# Patient Record
Sex: Female | Born: 1940 | Race: White | Hispanic: No | Marital: Married | Smoking: Former smoker
Health system: Southern US, Community
[De-identification: ages and names within clinical notes are randomized; demographics above are authoritative.]

## PROBLEM LIST (undated history)

## (undated) DIAGNOSIS — F329 Major depressive disorder, single episode, unspecified: Secondary | ICD-10-CM

## (undated) DIAGNOSIS — E785 Hyperlipidemia, unspecified: Secondary | ICD-10-CM

## (undated) DIAGNOSIS — D649 Anemia, unspecified: Secondary | ICD-10-CM

## (undated) DIAGNOSIS — K219 Gastro-esophageal reflux disease without esophagitis: Secondary | ICD-10-CM

## (undated) DIAGNOSIS — J45909 Unspecified asthma, uncomplicated: Secondary | ICD-10-CM

## (undated) DIAGNOSIS — Z7901 Long term (current) use of anticoagulants: Secondary | ICD-10-CM

## (undated) DIAGNOSIS — I4821 Permanent atrial fibrillation: Secondary | ICD-10-CM

## (undated) DIAGNOSIS — N6019 Diffuse cystic mastopathy of unspecified breast: Secondary | ICD-10-CM

## (undated) DIAGNOSIS — I10 Essential (primary) hypertension: Secondary | ICD-10-CM

## (undated) DIAGNOSIS — F32A Depression, unspecified: Secondary | ICD-10-CM

## (undated) DIAGNOSIS — J449 Chronic obstructive pulmonary disease, unspecified: Secondary | ICD-10-CM

## (undated) DIAGNOSIS — K589 Irritable bowel syndrome without diarrhea: Secondary | ICD-10-CM

## (undated) HISTORY — DX: Unspecified asthma, uncomplicated: J45.909

## (undated) HISTORY — DX: Essential (primary) hypertension: I10

## (undated) HISTORY — DX: Gastro-esophageal reflux disease without esophagitis: K21.9

## (undated) HISTORY — PX: NECK SURGERY: SHX720

## (undated) HISTORY — DX: Chronic obstructive pulmonary disease, unspecified: J44.9

## (undated) HISTORY — DX: Hyperlipidemia, unspecified: E78.5

## (undated) HISTORY — PX: CHOLECYSTECTOMY: SHX55

## (undated) HISTORY — DX: Diffuse cystic mastopathy of unspecified breast: N60.19

## (undated) HISTORY — DX: Irritable bowel syndrome, unspecified: K58.9

## (undated) HISTORY — PX: APPENDECTOMY: SHX54

## (undated) HISTORY — DX: Depression, unspecified: F32.A

## (undated) HISTORY — DX: Major depressive disorder, single episode, unspecified: F32.9

---

## 2000-01-13 ENCOUNTER — Emergency Department (HOSPITAL_COMMUNITY): Admission: EM | Admit: 2000-01-13 | Discharge: 2000-01-13 | Payer: Self-pay | Admitting: Emergency Medicine

## 2000-01-13 ENCOUNTER — Encounter: Payer: Self-pay | Admitting: Emergency Medicine

## 2000-07-23 ENCOUNTER — Other Ambulatory Visit: Admission: RE | Admit: 2000-07-23 | Discharge: 2000-07-23 | Payer: Self-pay | Admitting: *Deleted

## 2001-04-25 ENCOUNTER — Encounter: Admission: RE | Admit: 2001-04-25 | Discharge: 2001-04-25 | Payer: Self-pay | Admitting: *Deleted

## 2001-04-25 ENCOUNTER — Encounter: Payer: Self-pay | Admitting: *Deleted

## 2003-11-01 ENCOUNTER — Encounter: Admission: RE | Admit: 2003-11-01 | Discharge: 2003-11-01 | Payer: Self-pay | Admitting: Family Medicine

## 2004-08-29 ENCOUNTER — Encounter: Admission: RE | Admit: 2004-08-29 | Discharge: 2004-08-29 | Payer: Self-pay | Admitting: Family Medicine

## 2005-03-05 ENCOUNTER — Encounter: Admission: RE | Admit: 2005-03-05 | Discharge: 2005-03-05 | Payer: Self-pay | Admitting: Family Medicine

## 2005-06-28 ENCOUNTER — Encounter: Admission: RE | Admit: 2005-06-28 | Discharge: 2005-06-28 | Payer: Self-pay | Admitting: Family Medicine

## 2007-06-19 HISTORY — PX: TOTAL KNEE ARTHROPLASTY: SHX125

## 2008-03-02 ENCOUNTER — Inpatient Hospital Stay (HOSPITAL_COMMUNITY): Admission: RE | Admit: 2008-03-02 | Discharge: 2008-03-05 | Payer: Self-pay | Admitting: Orthopedic Surgery

## 2010-10-31 NOTE — Discharge Summary (Signed)
Heather Howard, Heather Howard                ACCOUNT NO.:  [id number]   MEDICAL RECORD NO.:  [id number]          PATIENT TYPE:  INP   LOCATION:  1616                         FACILITY:  [facility]   PHYSICIAN:  Deidre Ala, M.D.    DATE OF BIRTH:  1940-10-14   DATE OF ADMISSION:  03/02/2008  DATE OF DISCHARGE:  03/05/2008                               DISCHARGE SUMMARY   FINAL DIAGNOSES:  1. Degenerative joint disease, left knee.  2. Hypertension.  3. Asthma.   PROCEDURES:  March 02, 2008:  Left total knee arthroplasty.   SURGEON:  1. Charlesetta Shanks, M.D.   HISTORY:  This is a 70 year old Caucasian female followed by Dr. Renae Fickle  for chronic left knee pain.  She also has had pain in the right knee but  it is not as severe.  She has failed medical management for her left  knee pain and subsequently is ready to undergo total knee arthroplasty.  This was scheduled.  The patient was admitted on March 02, 2008.   HOSPITAL COURSE:  After admission the patient underwent total knee  arthroplasty of her left knee on March 02, 2008.  She tolerated the  procedure well with no intraoperative complications.  Postop the patient  did well for the most part.  She was not ready to go home.  During her  stay she was planning on going to a skilled nursing facility for rehab  prior to going home after discharge.  Her course overall she did well.  She did have knee pain initially which did improve with time.  She was  getting out of bed with the help of physical therapy.  She was not quite  getting out by herself prior to discharge.  Lab work was done.  She did  have postop blood loss anemia.  Her hemoglobin on March 05, 2008 was  7.8 with hematocrit 23.9.  This was repeated because this was felt not  to be correct, and we did want to give blood unnecessarily, so a repeat  CBC was done on her and it showed that hemoglobin was 9.1, hematocrit  27.6.  Area nursing homes were contacted for this patient and  prior to  March 05, 2008 two choices were [facility] and [facility], and these  were being checked at the time of this discharge.  She is ready for  discharge to a skilled nursing facility when it becomes available.  It  is anticipated it will be available today and we will discharge her  today.   At the time of discharge her medications were:  1. Lovenox 30 mg subcu q. 12 h.  She will take this for the next 10      days.  2. She is on ferrous sulfate 325 mg t.i.d. p.c.  This can be stopped      at discharge.  3. She will continue on Percocet 5/325 one p.o. q.4-6 p.r.n. pain.  4. Skelaxin 800 mg take 1 p.o. q.i.d. p.r.n.  5. She is on Diovan HCT 320/12.5 mg daily.  6. She is on omeprazole 20 mg  b.i.d.  7. She is on Singulair 10 mg daily.  8. She is on Xyzal 5 mg daily.  9. She was on nabumetone.  She can stop this at this time.  10.She is on Azmacort 240 mg 4 puffs a.m. and p.m.  11.Proventil.  She is on 2 puffs b.i.d.  12.She is on benzonatate 200 mg 3 times a day as needed.  13.She takes __________ 0.125 mg sublingually as needed.  14.She is on desipramine 50 mg as needed.  15.She takes Mucinex as needed.  16.She takes Benadryl 25 mg as needed.   SHE DOES HAVE AN ALLERGY TO CARBOCAINE 3% AND PENICILLIN.   She is ready for discharge at this time and we will discharge her on  acceptance to a nursing facility.  We will see her back in Dr. Ollen Gross  office in 10 days.      Phineas Semen, P.A.    ______________________________  Seth Bake. Charlesetta Shanks, M.D.    CL/MEDQ  D:  03/05/2008  T:  03/05/2008  Job:  [id number]

## 2010-10-31 NOTE — Op Note (Signed)
Heather Howard, GILLENTINE                ACCOUNT NO.:  [id number]   MEDICAL RECORD NO.:  [id number]          PATIENT TYPE:  INP   LOCATION:  0006                         FACILITY:  [facility]   PHYSICIAN:  Deidre Ala, M.D.    DATE OF BIRTH:  [birth year]   DATE OF PROCEDURE:  03/02/2008  DATE OF DISCHARGE:                               OPERATIVE REPORT   PREOPERATIVE DIAGNOSES:  1. End-stage degenerative joint disease left knee.  2. High body mass index.   POSTOPERATIVE DIAGNOSES:  1. End-stage degenerative joint disease left knee.  2. High body mass index.   PROCEDURE:  Left total knee arthroplasty using DePuy cemented  components, LCS type with rotating platform and MVT stem.   SURGEON:  1. Charlesetta Shanks, M.D.   ASSISTANT:  Phineas Semen, P.A.   ANESTHESIA:  General with LMA.   CULTURES:  None.   DRAINS:  Two medium Hemovacs to self suction.   ESTIMATED BLOOD LOSS:  100 mL.   REPLACED:  Without.   TOURNIQUET TIME:  Seventy-six minutes.   PATHOLOGIC FINDINGS AND HISTORY:  Ms. Piltz has end-stage DJD bilateral  knees, left greater than right.  At surgery, she had tricompartment  disease.  She does have some osteoporosis.  We were able to fit her with  a medium left femur, a 12.5 rotating platform, a 3 revision cemented  tray, a size medium rotating platform 10 mm, universal stem fluted at 75  x 14-mm and a 32 mm oval domed three peg patella.  We used two batches  of cement with 1.3 grams of tobramycin per batch.  We had full  extension, flexion to 105 with good stability, varus-valgus stressing.  We did do a lateral release to improve tilt and track of the patella,  but we had overall good ligamentous balance.   DESCRIPTION OF PROCEDURE:  With adequate anesthesia obtained using LMA  technique, 1 gm vancomycin given IV prophylaxis, the patient was placed  in the supine position.  The left lower extremity was prepped from the  toes to the tourniquet in the standard fashion.   After standard prepping  and draping, Esmarch exsanguination was used.  The tourniquet was let up  to 250 mmHg.  A median parapatellar skin incision was followed by a  median parapatellar retinacular incision.  The incision was deepened  sharply with a knife and hemostasis obtained using the Bovie  electrocoagulator.  The patella was then everted.  Fat pad excised, as  well as both cruciates and the menisci.  Retractors were placed.  I then  amputated the tibial spine.  I then drilled down the intermedullary  canal of the tibia and reamed successively to a 14.  I then placed the  tibial cutting jig in place with the intramedullary reamer, set it and  made a very conservative cut.  I then sized to a medium, drilled the  intramedullary anterior-posterior cutting jig guide into the canal and  set it with a 12.5 C clamp, actually reaming it up a total of 5 mm  __________flexion gap.  I set it for the  12.5.  I then made the anterior-  posterior cuts and even with moving it up, we still came just at the  edge of the anterior femur on the anterior cut.  In flexion, we fit at  this time 10, later of course it was 12.5.  We then placed a 4 degree  distal valgus cutting jig in place.  We cut 5 back, made that cut and  then we fit the 10 in flexion and extension.  We then placed the  finishing guide on the distal femur and made those cuts.  We then sized  the femur to a 3, drilled the intramedullary conical reamer, then the  distal reamer for the stem.  We impacted the trial and then impacted it  with the keel.  We then placed on the trial femur and articulated the  knee through a range of motion.  The patella was then callipered to a  23.  We cut it down with the cutting jig to 13, placed the three peg  patella template, made those drill holes and then trialed the patella.  It was tight, so we did a lateral release.  All trial components were  then removed while we checked for sizing as the  components came on the  field.  Cement was mixed with tobramycin in a cement gun.  We assembled  the tibia.  We then cemented on the tibial component, impacted it and  removed excess cement.  We then put in a 10 rotating platform.  We then  cemented on the femoral component, impacted it, removed excess cement  and held until the cement had cured.  We cemented on the patella  component, impacted it and removed the excess cement.  I felt we were a  little hyperextended, so I trialed a 12.5.  I then settled on a 12.5 and  implanted a 12.5 rotating platform with full extension, but no  hyperextension and flexion as above.  Remember that prior to  implantation, a thorough jet lavage was carried out.  We then again jet  lavaged the knee while the cement was curing.  The tourniquet was let  down and bleeding points were cauterized.  Hemovac drains were placed in  the mediolateral gutter and brought out the superolateral portal.  The  wound was then closed in layers with figure-of-eight Vicryl on the  retinaculum #1, running locking oversew of #1 PDS, 0, 2-0 and 3-0 Vicryl  on the subcu and skin staples.  Hemovac to self suction with the  charging of the wound through the Hemovac with 20 mg of gentamicin and  20 mL of saline.  A bulky sterile compressive dressing was applied.  Hemovac was hooked up to self suction.  The patient then having  tolerated the procedure well was awakened and taken to the recovery room  in satisfactory condition for routine postoperative care, CPM and  analgesia.           ______________________________  V. Charlesetta Shanks, M.D.     VEP/MEDQ  D:  03/02/2008  T:  03/04/2008  Job:  [id number]   cc:   Burnell Blanks, MD  Fax: [phone]   Otho Darner, M.D.  Fax: [phone]

## 2011-03-19 LAB — BASIC METABOLIC PANEL
BUN: 6
BUN: 9
CO2: 29
Chloride: 94 — ABNORMAL LOW
Creatinine, Ser: 0.7
GFR calc Af Amer: >60
GFR calc non Af Amer: >60
GFR calc non Af Amer: >60
Glucose, Bld: 121 — ABNORMAL HIGH
Glucose, Bld: 143 — ABNORMAL HIGH
Glucose, Bld: 148 — ABNORMAL HIGH
Potassium: 4.2
Potassium: 4.2
Sodium: 134 — ABNORMAL LOW

## 2011-03-19 LAB — CBC
HCT: 26.7 — ABNORMAL LOW
HCT: 27.6 — ABNORMAL LOW
HCT: 29.9 — ABNORMAL LOW
Hemoglobin: 9 — ABNORMAL LOW
Hemoglobin: 9.1 — ABNORMAL LOW
Hemoglobin: 9.8 — ABNORMAL LOW
MCV: 92.4
MCV: 93.3
Platelets: 295
Platelets: 375
Platelets: 432 — ABNORMAL HIGH
RBC: 3.19 — ABNORMAL LOW
RDW: 14.1
RDW: 14.5
RDW: 14.6
WBC: 11.2 — ABNORMAL HIGH
WBC: 11.8 — ABNORMAL HIGH
WBC: 8.6

## 2011-03-19 LAB — CROSSMATCH
ABO/RH(D): A POS
Antibody Screen: NEGATIVE

## 2011-03-19 LAB — PREPARE RBC (CROSSMATCH)

## 2011-03-19 LAB — DIFFERENTIAL
Basophils Absolute: 0.1
Eosinophils Relative: 2
Lymphocytes Relative: 18
Lymphs Abs: 2
Neutro Abs: 8.1 — ABNORMAL HIGH

## 2011-03-19 LAB — ABO/RH: ABO/RH(D): A POS

## 2011-03-21 LAB — BASIC METABOLIC PANEL
CO2: 29
Calcium: 9.9
Chloride: 103
Creatinine, Ser: 0.85
GFR calc Af Amer: >60
Glucose, Bld: 109 — ABNORMAL HIGH

## 2011-03-21 LAB — DIFFERENTIAL
Basophils Absolute: 0.1
Basophils Relative: 1
Eosinophils Absolute: 0.4
Monocytes Absolute: 0.5
Monocytes Relative: 5
Neutro Abs: 6.6
Neutrophils Relative %: 68

## 2011-03-21 LAB — CBC
MCHC: 33.1
MCV: 92.5
RBC: 4.23
RDW: 14.8

## 2011-03-21 LAB — URINALYSIS, ROUTINE W REFLEX MICROSCOPIC
Bilirubin Urine: NEGATIVE
Ketones, ur: NEGATIVE
Nitrite: NEGATIVE
Protein, ur: NEGATIVE
Urobilinogen, UA: 0.2

## 2011-03-21 LAB — PROTIME-INR: INR: 1.1

## 2011-08-01 ENCOUNTER — Other Ambulatory Visit: Payer: Self-pay | Admitting: Otolaryngology

## 2011-08-01 DIAGNOSIS — R1310 Dysphagia, unspecified: Secondary | ICD-10-CM

## 2011-08-10 ENCOUNTER — Ambulatory Visit
Admission: RE | Admit: 2011-08-10 | Discharge: 2011-08-10 | Disposition: A | Discharge status: Home / Self Care | Payer: Medicare Other | Source: Ambulatory Visit | Attending: Otolaryngology | Admitting: Otolaryngology

## 2011-08-10 DIAGNOSIS — R1310 Dysphagia, unspecified: Secondary | ICD-10-CM

## 2014-09-02 ENCOUNTER — Telehealth: Payer: Self-pay | Admitting: Cardiology

## 2014-09-02 ENCOUNTER — Telehealth (HOSPITAL_COMMUNITY): Payer: Self-pay | Admitting: Cardiology

## 2014-09-02 NOTE — Telephone Encounter (Signed)
Received records from [facility] for appointment with Dr Stanford Breed on 09/21/14.  Records given to [facility] (medical records) for Dr Jacalyn Lefevre schedule on 09/21/14. lp

## 2014-09-20 NOTE — Progress Notes (Signed)
HPI: 74 year old female for evaluation of atrial fibrillation. Patient recently seen by her primary care physician and noted to be in atrial fibrillation. We were asked to evaluate. She occasionally has dyspnea with extreme activities. No orthopnea, PND, pedal edema, palpitations, chest pain or syncope. Occasional days of weakness and dizziness that do not appear to be new.  Current Outpatient Prescriptions  Medication Sig Dispense Refill  . benzonatate (TESSALON) 200 MG capsule Take 200 mg by mouth as needed for cough.    . Esomeprazole Magnesium (NEXIUM PO) Take 1 capsule by mouth as needed.    Marland Kitchen FLOVENT HFA 110 MCG/ACT inhaler Place 2 puffs into the nose 2 (two) times daily.    . hydrOXYzine (ATARAX/VISTARIL) 25 MG tablet Take 25 mg by mouth as needed.    . hyoscyamine (ANASPAZ) 0.125 MG TBDP disintergrating tablet Place 0.125 mg under the tongue as needed.    . methocarbamol (ROBAXIN) 750 MG tablet Take 750 mg by mouth as needed for muscle spasms.    . montelukast (SINGULAIR) 10 MG tablet Take 1 tablet by mouth daily.    . rivaroxaban (XARELTO) 20 MG TABS tablet Take 20 mg by mouth daily with supper.    . valsartan-hydrochlorothiazide (DIOVAN-HCT) 320-25 MG per tablet Take 1 tablet by mouth daily.    . VENTOLIN HFA 108 (90 BASE) MCG/ACT inhaler 2 puffs 4 (four) times daily.     No current facility-administered medications for this visit.    Allergies  Allergen Reactions  . Carbocaine [Mepivacaine Hcl] Swelling  . Penicillins Swelling     Past Medical History  Diagnosis Date  . Hypertension   . Hyperlipidemia   . COPD (chronic obstructive pulmonary disease)   . Depression   . IBS (irritable bowel syndrome)   . GERD (gastroesophageal reflux disease)   . Fibrocystic breast disease   . Asthma     Past Surgical History  Procedure Laterality Date  . Total knee arthroplasty Bilateral 2009  . Cesarean section    . Neck surgery    . Cholecystectomy    . Appendectomy       History   Social History  . Marital Status: Married    Spouse Name: N/A  . Number of Children: 3  . Years of Education: N/A   Occupational History  . Not on file.   Social History Main Topics  . Smoking status: Former Smoker    Quit date: 09/20/1964  . Smokeless tobacco: Not on file  . Alcohol Use: 0.0 oz/week    0 Standard drinks or equivalent per week     Comment: Rare  . Drug Use: No  . Sexual Activity: Not on file   Other Topics Concern  . Not on file   Social History Narrative  . No narrative on file    Family History  Problem Relation Age of Onset  . Diabetes Mellitus I Mother   . Heart disease Mother     ROS: occasional cough and weakness but no fevers or chills, productive cough, hemoptysis, dysphasia, odynophagia, melena, hematochezia, dysuria, hematuria, rash, seizure activity, orthopnea, PND, pedal edema, claudication. Remaining systems are negative.  Physical Exam:   Blood pressure 118/80, pulse 64, height 5\' 3"  (1.6 m), weight 194 lb 8 oz (88.225 kg).  General:  Well developed/well nourished in NAD Skin warm/dry Patient not depressed No peripheral clubbing Back-normal HEENT-normal/normal eyelids Neck supple/normal carotid upstroke bilaterally; no bruits; no JVD; no thyromegaly chest - CTA/ normal expansion CV - irregular/normal  S1 and S2; no murmurs, rubs or gallops;  PMI nondisplaced Abdomen -NT/ND, no HSM, no mass, + bowel sounds, no bruit 2+ femoral pulses, no bruits Ext-no edema, chords, 2+ DP Neuro-grossly nonfocal  ECG 08/31/2014-atrial fibrillation, right bundle branch block, nonspecific ST changes.  Electrocardiogram today shows atrial fibrillation, RV conduction delay and nonspecific ST changes.

## 2014-09-21 ENCOUNTER — Encounter: Payer: Self-pay | Admitting: Cardiology

## 2014-09-21 ENCOUNTER — Encounter: Payer: Self-pay | Admitting: *Deleted

## 2014-09-21 ENCOUNTER — Ambulatory Visit (INDEPENDENT_AMBULATORY_CARE_PROVIDER_SITE_OTHER): Payer: Commercial Managed Care - HMO | Admitting: Cardiology

## 2014-09-21 VITALS — BP 118/80 | HR 64 | Ht 63.0 in | Wt 194.5 lb

## 2014-09-21 DIAGNOSIS — I10 Essential (primary) hypertension: Secondary | ICD-10-CM

## 2014-09-21 DIAGNOSIS — J45909 Unspecified asthma, uncomplicated: Secondary | ICD-10-CM | POA: Insufficient documentation

## 2014-09-21 DIAGNOSIS — J438 Other emphysema: Secondary | ICD-10-CM | POA: Diagnosis not present

## 2014-09-21 DIAGNOSIS — I4891 Unspecified atrial fibrillation: Secondary | ICD-10-CM | POA: Diagnosis not present

## 2014-09-21 LAB — CBC
HCT: 37.5 % (ref 36.0–46.0)
Hemoglobin: 12.3 g/dL (ref 12.0–15.0)
MCH: 30.3 pg (ref 26.0–34.0)
MCHC: 32.8 g/dL (ref 30.0–36.0)
MCV: 92.4 fL (ref 78.0–100.0)
MPV: 8.9 fL (ref 8.6–12.4)
Platelets: 419 10*3/uL — ABNORMAL HIGH (ref 150–400)
RBC: 4.06 MIL/uL (ref 3.87–5.11)
RDW: 14 % (ref 11.5–15.5)
WBC: 7.6 10*3/uL (ref 4.0–10.5)

## 2014-09-21 LAB — BASIC METABOLIC PANEL WITH GFR
BUN: 15 mg/dL (ref 6–23)
CALCIUM: 9.3 mg/dL (ref 8.4–10.5)
CO2: 24 mEq/L (ref 19–32)
Chloride: 99 mEq/L (ref 96–112)
Creat: 0.8 mg/dL (ref 0.50–1.10)
GFR, EST AFRICAN AMERICAN: 83 mL/min
GFR, EST NON AFRICAN AMERICAN: 72 mL/min
GLUCOSE: 99 mg/dL (ref 70–99)
POTASSIUM: 4 meq/L (ref 3.5–5.3)
SODIUM: 134 meq/L — AB (ref 135–145)

## 2014-09-21 LAB — TSH: TSH: 1.674 u[IU]/mL (ref 0.350–4.500)

## 2014-09-21 MED ORDER — RIVAROXABAN 20 MG PO TABS: 20.0000 mg | ORAL_TABLET | Freq: Every day | ORAL | Status: DC

## 2014-09-21 NOTE — Assessment & Plan Note (Signed)
Blood pressure controlled. Continue present medications. 

## 2014-09-21 NOTE — Patient Instructions (Signed)
Your physician wants you to follow-up in: 3 [place] will receive a reminder letter in the mail two months in advance. If you don't receive a letter, please call our office to schedule the follow-up appointment.   Your physician has requested that you have an echocardiogram. Echocardiography is a painless test that uses sound waves to create images of your heart. It provides your doctor with information about the size and shape of your heart and how well your heart's chambers and valves are working. This procedure takes approximately one hour. There are no restrictions for this procedure.SCHEDULE AT [place]  Your physician has recommended that you wear a 24 HOURholter monitor. Holter monitors are medical devices that record the heart's electrical activity. Doctors most often use these monitors to diagnose arrhythmias. Arrhythmias are problems with the speed or rhythm of the heartbeat. The monitor is a small, portable device. You can wear one while you do your normal daily activities. This is usually used to diagnose what is causing palpitations/syncope (passing out).SCHEDULE AT [place]  Your physician recommends that you HAVE LAB WORK TODAY

## 2014-09-21 NOTE — Assessment & Plan Note (Signed)
Patient presents for evaluation of atrial fibrillation. This is a new diagnosis and duration is unknown. She is essentially asymptomatic. We discussed rhythm control versus rate control. I would favor the latter. Embolic risk factors of age 74, hypertension, female sex and borderline diabetes mellitus. CHADS vasc 5; continue xarelto. Check hemoglobin, renal function and TSH. Check echocardiogram. Her rate appears to be controlled on the medications. Check 24-hour Holter monitor.

## 2014-09-21 NOTE — Assessment & Plan Note (Signed)
Management per primary care. 

## 2014-09-23 ENCOUNTER — Encounter (INDEPENDENT_AMBULATORY_CARE_PROVIDER_SITE_OTHER): Payer: Commercial Managed Care - HMO

## 2014-09-23 ENCOUNTER — Ambulatory Visit (HOSPITAL_COMMUNITY): Payer: Commercial Managed Care - HMO | Attending: Cardiology | Admitting: Radiology

## 2014-09-23 ENCOUNTER — Encounter: Payer: Self-pay | Admitting: *Deleted

## 2014-09-23 DIAGNOSIS — I4891 Unspecified atrial fibrillation: Secondary | ICD-10-CM | POA: Insufficient documentation

## 2014-09-23 NOTE — Progress Notes (Signed)
Patient ID: Heather Howard, female   DOB: 06-May-1941, 74 y.o.   MRN: [id number] [employer] 24 hour holter monitor applied to patient by [place].

## 2014-09-23 NOTE — Progress Notes (Signed)
Echocardiogram performed.  

## 2014-10-01 ENCOUNTER — Telehealth: Payer: Self-pay | Admitting: *Deleted

## 2014-10-01 NOTE — Telephone Encounter (Signed)
Monitor reviewed by dr Stanford Breed shows atrial fib, rate controlled.  pt aware of results

## 2014-12-30 NOTE — Progress Notes (Signed)
      HPI: FU atrial fibrillation. Patient recently seen by her primary care physician and noted to be in atrial fibrillation. We were asked to evaluate. Echo 4/16 showed normal LV function, mild AI, biatrial enlargement, trace MR, mild TR. Holter 4/16 showed rate controlled atrial fibrillation. Since last seen, the patient has dyspnea with more extreme activities but not with routine activities. It is relieved with rest. It is not associated with chest pain. There is no orthopnea, PND or pedal edema. There is no syncope or palpitations. There is no exertional chest pain.   Current Outpatient Prescriptions  Medication Sig Dispense Refill  . benzonatate (TESSALON) 200 MG capsule Take 200 mg by mouth as needed for cough.    . Esomeprazole Magnesium (NEXIUM PO) Take 1 capsule by mouth as needed.    Marland Kitchen FLOVENT HFA 110 MCG/ACT inhaler Place 2 puffs into the nose 2 (two) times daily.    . hydrOXYzine (ATARAX/VISTARIL) 25 MG tablet Take 25 mg by mouth as needed.    . hyoscyamine (ANASPAZ) 0.125 MG TBDP disintergrating tablet Place 0.125 mg under the tongue as needed.    . methocarbamol (ROBAXIN) 750 MG tablet Take 750 mg by mouth as needed for muscle spasms.    . montelukast (SINGULAIR) 10 MG tablet Take 1 tablet by mouth daily.    . rivaroxaban (XARELTO) 20 MG TABS tablet Take 1 tablet (20 mg total) by mouth daily with supper. 30 tablet 12  . valsartan-hydrochlorothiazide (DIOVAN-HCT) 320-25 MG per tablet Take 1 tablet by mouth daily.    . VENTOLIN HFA 108 (90 BASE) MCG/ACT inhaler 2 puffs 4 (four) times daily.     No current facility-administered medications for this visit.     Past Medical History  Diagnosis Date  . Hypertension   . Hyperlipidemia   . COPD (chronic obstructive pulmonary disease)   . Depression   . IBS (irritable bowel syndrome)   . GERD (gastroesophageal reflux disease)   . Fibrocystic breast disease   . Asthma     Past Surgical History  Procedure Laterality Date  .  Total knee arthroplasty Bilateral 2009  . Cesarean section    . Neck surgery    . Cholecystectomy    . Appendectomy      History   Social History  . Marital Status: Married    Spouse Name: N/A  . Number of Children: 3  . Years of Education: N/A   Occupational History  . Not on file.   Social History Main Topics  . Smoking status: Former Smoker    Quit date: 09/20/1964  . Smokeless tobacco: Not on file  . Alcohol Use: 0.0 oz/week    0 Standard drinks or equivalent per week     Comment: Rare  . Drug Use: No  . Sexual Activity: Not on file   Other Topics Concern  . Not on file   Social History Narrative    ROS: no fevers or chills, productive cough, hemoptysis, dysphasia, odynophagia, melena, hematochezia, dysuria, hematuria, rash, seizure activity, orthopnea, PND, pedal edema, claudication. Remaining systems are negative.  Physical Exam: Well-developed obese in no acute distress.  Skin is warm and dry.  HEENT is normal.  Neck is supple.  Chest is clear to auscultation with normal expansion.  Cardiovascular exam is irregular Abdominal exam nontender or distended. No masses palpated. Extremities show no edema. neuro grossly intact

## 2015-01-03 ENCOUNTER — Ambulatory Visit (INDEPENDENT_AMBULATORY_CARE_PROVIDER_SITE_OTHER): Payer: Commercial Managed Care - HMO | Admitting: Cardiology

## 2015-01-03 ENCOUNTER — Encounter: Payer: Self-pay | Admitting: Cardiology

## 2015-01-03 VITALS — BP 148/83 | HR 61 | Ht 63.0 in | Wt 195.7 lb

## 2015-01-03 DIAGNOSIS — I482 Chronic atrial fibrillation, unspecified: Secondary | ICD-10-CM

## 2015-01-03 DIAGNOSIS — I10 Essential (primary) hypertension: Secondary | ICD-10-CM | POA: Diagnosis not present

## 2015-01-03 NOTE — Assessment & Plan Note (Signed)
Patient remains in permanent atrial fibrillation.  She is asymptomatic. Plan rate control. Previous monitor showed rate is controlled on no medications. Embolic risk factors of age 74, hypertension, female sex and borderline diabetes mellitus. CHADS vasc 5; continue xarelto. She states her primary care doctor checks her blood work. She is scheduled to see him tomorrow. We would like her hemoglobin and renal function checked every 6 months.

## 2015-01-03 NOTE — Patient Instructions (Signed)
Your physician wants you to follow-up in: [place] will receive a reminder letter in the mail two months in advance. If you don't receive a letter, please call our office to schedule the follow-up appointment.   LAB WORK= CBC AND BMP

## 2015-01-03 NOTE — Assessment & Plan Note (Signed)
Blood pressure controlled. Continue present medications. 

## 2015-03-09 ENCOUNTER — Telehealth: Payer: Self-pay | Admitting: Cardiology

## 2015-03-09 NOTE — Telephone Encounter (Signed)
Can either check on price of other NOACs or refer to coumadin clinic for initiation Heather Howard

## 2015-03-09 NOTE — Telephone Encounter (Signed)
Pt called in stating the her Xarelto is becoming expensive and she would like to find another medication to take that she can afford. Please f/u with her   Thanks

## 2015-03-09 NOTE — Telephone Encounter (Signed)
Spoke with pt, she is aware of the options. Will check for samples for the pt and then she will decide what she wants to do

## 2015-03-09 NOTE — Telephone Encounter (Signed)
Returned call to patient she stated xarelto too expensive, she has about 1 week left and would like a cheaper blood thinner.Message sent to [facility] for advice.

## 2015-03-10 NOTE — Telephone Encounter (Signed)
Spoke with pt, aware no samples available today. 

## 2015-06-21 DIAGNOSIS — M2550 Pain in unspecified joint: Secondary | ICD-10-CM | POA: Diagnosis not present

## 2015-06-21 DIAGNOSIS — M5489 Other dorsalgia: Secondary | ICD-10-CM | POA: Diagnosis not present

## 2015-06-21 DIAGNOSIS — M546 Pain in thoracic spine: Secondary | ICD-10-CM | POA: Diagnosis not present

## 2015-06-22 ENCOUNTER — Telehealth: Payer: Self-pay | Admitting: Cardiology

## 2015-06-22 MED ORDER — APIXABAN 5 MG PO TABS
5.0000 mg | ORAL_TABLET | Freq: Two times a day (BID) | ORAL | Status: DC
Start: 2015-06-22 — End: 2015-08-08

## 2015-06-22 NOTE — Telephone Encounter (Addendum)
**Note De-Identified Heather Howard Obfuscation** Per Warnell Bureau, Pharm D the pt is advised to stop taking Xarelto and to start taking Eliquis 5 mg BID and to not take her first dose of Eliquis until 24 hours after her last dose of Xarelto. She verbalized understanding and thanked me for my assistance.  Eliquis 5 mg BID sent to [facility] in [place] to fill per the pts request.

## 2015-06-22 NOTE — Telephone Encounter (Signed)
The pt states that she started inching on her arms and the top of her ears in the Fall. She has been using anti itch ointments/creams and taking Benadryl to ease her itching. She states that the itching has been getting worse and she now c/o itching on her buttocks, stomach, around an old incision and arms  She wants to know what to do or if there is another medication she can take in place of Xarelto. Please advise.

## 2015-06-22 NOTE — Telephone Encounter (Signed)
Pt c/o medication issue:  1. Name of Medication: Xarelto   2. How are you currently taking this medication (dosage and times per day)? 1 tab po daily   3. Are you having a reaction (difficulty breathing--STAT)? SOB.Heather Kitchenshe just states that she is having severe itching.   4. What is your medication issue? Having severe itching.Heather Kitchen

## 2015-06-23 DIAGNOSIS — M546 Pain in thoracic spine: Secondary | ICD-10-CM | POA: Diagnosis not present

## 2015-06-23 DIAGNOSIS — M2550 Pain in unspecified joint: Secondary | ICD-10-CM | POA: Diagnosis not present

## 2015-06-23 DIAGNOSIS — M5489 Other dorsalgia: Secondary | ICD-10-CM | POA: Diagnosis not present

## 2015-06-29 DIAGNOSIS — L299 Pruritus, unspecified: Secondary | ICD-10-CM | POA: Diagnosis not present

## 2015-06-29 DIAGNOSIS — L300 Nummular dermatitis: Secondary | ICD-10-CM | POA: Diagnosis not present

## 2015-07-05 DIAGNOSIS — M546 Pain in thoracic spine: Secondary | ICD-10-CM | POA: Diagnosis not present

## 2015-07-05 DIAGNOSIS — M2550 Pain in unspecified joint: Secondary | ICD-10-CM | POA: Diagnosis not present

## 2015-07-05 DIAGNOSIS — M5489 Other dorsalgia: Secondary | ICD-10-CM | POA: Diagnosis not present

## 2015-07-12 DIAGNOSIS — M546 Pain in thoracic spine: Secondary | ICD-10-CM | POA: Diagnosis not present

## 2015-07-12 DIAGNOSIS — M2550 Pain in unspecified joint: Secondary | ICD-10-CM | POA: Diagnosis not present

## 2015-07-12 DIAGNOSIS — M5489 Other dorsalgia: Secondary | ICD-10-CM | POA: Diagnosis not present

## 2015-07-14 DIAGNOSIS — M5489 Other dorsalgia: Secondary | ICD-10-CM | POA: Diagnosis not present

## 2015-07-14 DIAGNOSIS — M546 Pain in thoracic spine: Secondary | ICD-10-CM | POA: Diagnosis not present

## 2015-07-14 DIAGNOSIS — M2550 Pain in unspecified joint: Secondary | ICD-10-CM | POA: Diagnosis not present

## 2015-07-21 DIAGNOSIS — M2550 Pain in unspecified joint: Secondary | ICD-10-CM | POA: Diagnosis not present

## 2015-07-21 DIAGNOSIS — M5489 Other dorsalgia: Secondary | ICD-10-CM | POA: Diagnosis not present

## 2015-07-21 DIAGNOSIS — M546 Pain in thoracic spine: Secondary | ICD-10-CM | POA: Diagnosis not present

## 2015-08-03 ENCOUNTER — Inpatient Hospital Stay (HOSPITAL_COMMUNITY)
Admission: AD | Admit: 2015-08-03 | Discharge: 2015-08-08 | DRG: 249 | Disposition: A | Discharge status: Home / Self Care | Payer: Medicare HMO | Source: Other Acute Inpatient Hospital | Attending: Cardiology | Admitting: Cardiology

## 2015-08-03 DIAGNOSIS — D649 Anemia, unspecified: Secondary | ICD-10-CM | POA: Diagnosis not present

## 2015-08-03 DIAGNOSIS — J449 Chronic obstructive pulmonary disease, unspecified: Secondary | ICD-10-CM | POA: Diagnosis present

## 2015-08-03 DIAGNOSIS — E871 Hypo-osmolality and hyponatremia: Secondary | ICD-10-CM | POA: Diagnosis not present

## 2015-08-03 DIAGNOSIS — I480 Paroxysmal atrial fibrillation: Secondary | ICD-10-CM | POA: Diagnosis not present

## 2015-08-03 DIAGNOSIS — R911 Solitary pulmonary nodule: Secondary | ICD-10-CM | POA: Diagnosis present

## 2015-08-03 DIAGNOSIS — Z7901 Long term (current) use of anticoagulants: Secondary | ICD-10-CM

## 2015-08-03 DIAGNOSIS — I482 Chronic atrial fibrillation: Secondary | ICD-10-CM | POA: Diagnosis present

## 2015-08-03 DIAGNOSIS — I255 Ischemic cardiomyopathy: Secondary | ICD-10-CM | POA: Diagnosis present

## 2015-08-03 DIAGNOSIS — D6489 Other specified anemias: Secondary | ICD-10-CM | POA: Diagnosis not present

## 2015-08-03 DIAGNOSIS — Z79899 Other long term (current) drug therapy: Secondary | ICD-10-CM | POA: Diagnosis not present

## 2015-08-03 DIAGNOSIS — K449 Diaphragmatic hernia without obstruction or gangrene: Secondary | ICD-10-CM | POA: Diagnosis not present

## 2015-08-03 DIAGNOSIS — Z955 Presence of coronary angioplasty implant and graft: Secondary | ICD-10-CM

## 2015-08-03 DIAGNOSIS — I4510 Unspecified right bundle-branch block: Secondary | ICD-10-CM | POA: Diagnosis present

## 2015-08-03 DIAGNOSIS — I214 Non-ST elevation (NSTEMI) myocardial infarction: Principal | ICD-10-CM | POA: Diagnosis present

## 2015-08-03 DIAGNOSIS — R7303 Prediabetes: Secondary | ICD-10-CM | POA: Diagnosis present

## 2015-08-03 DIAGNOSIS — Z96653 Presence of artificial knee joint, bilateral: Secondary | ICD-10-CM | POA: Diagnosis present

## 2015-08-03 DIAGNOSIS — E785 Hyperlipidemia, unspecified: Secondary | ICD-10-CM | POA: Diagnosis present

## 2015-08-03 DIAGNOSIS — I959 Hypotension, unspecified: Secondary | ICD-10-CM | POA: Diagnosis not present

## 2015-08-03 DIAGNOSIS — I4891 Unspecified atrial fibrillation: Secondary | ICD-10-CM | POA: Diagnosis not present

## 2015-08-03 DIAGNOSIS — Z87891 Personal history of nicotine dependence: Secondary | ICD-10-CM | POA: Diagnosis not present

## 2015-08-03 DIAGNOSIS — E876 Hypokalemia: Secondary | ICD-10-CM | POA: Diagnosis not present

## 2015-08-03 DIAGNOSIS — I10 Essential (primary) hypertension: Secondary | ICD-10-CM | POA: Diagnosis not present

## 2015-08-03 DIAGNOSIS — J984 Other disorders of lung: Secondary | ICD-10-CM | POA: Diagnosis not present

## 2015-08-03 DIAGNOSIS — I481 Persistent atrial fibrillation: Secondary | ICD-10-CM | POA: Diagnosis not present

## 2015-08-03 DIAGNOSIS — Z7951 Long term (current) use of inhaled steroids: Secondary | ICD-10-CM | POA: Diagnosis not present

## 2015-08-03 DIAGNOSIS — J45909 Unspecified asthma, uncomplicated: Secondary | ICD-10-CM | POA: Diagnosis present

## 2015-08-03 DIAGNOSIS — R591 Generalized enlarged lymph nodes: Secondary | ICD-10-CM | POA: Diagnosis present

## 2015-08-03 DIAGNOSIS — I2510 Atherosclerotic heart disease of native coronary artery without angina pectoris: Secondary | ICD-10-CM | POA: Diagnosis not present

## 2015-08-03 DIAGNOSIS — J438 Other emphysema: Secondary | ICD-10-CM | POA: Diagnosis not present

## 2015-08-03 DIAGNOSIS — R079 Chest pain, unspecified: Secondary | ICD-10-CM | POA: Diagnosis not present

## 2015-08-03 HISTORY — DX: Anemia, unspecified: D64.9

## 2015-08-03 HISTORY — DX: Permanent atrial fibrillation: I48.21

## 2015-08-03 HISTORY — DX: Long term (current) use of anticoagulants: Z79.01

## 2015-08-03 LAB — CBC WITH DIFFERENTIAL/PLATELET
BASOS ABS: 0 10*3/uL (ref 0.0–0.1)
Basophils Relative: 0 %
EOS PCT: 1 %
Eosinophils Absolute: 0.1 10*3/uL (ref 0.0–0.7)
HCT: 31.5 % — ABNORMAL LOW (ref 36.0–46.0)
Hemoglobin: 10.1 g/dL — ABNORMAL LOW (ref 12.0–15.0)
LYMPHS ABS: 1.2 10*3/uL (ref 0.7–4.0)
LYMPHS PCT: 13 %
MCH: 29.4 pg (ref 26.0–34.0)
MCHC: 32.1 g/dL (ref 30.0–36.0)
MCV: 91.6 fL (ref 78.0–100.0)
Monocytes Absolute: 0.8 10*3/uL (ref 0.1–1.0)
Monocytes Relative: 8 %
NEUTROS ABS: 7.6 10*3/uL (ref 1.7–7.7)
Neutrophils Relative %: 78 %
PLATELETS: 357 10*3/uL (ref 150–400)
RBC: 3.44 MIL/uL — AB (ref 3.87–5.11)
RDW: 14.6 % (ref 11.5–15.5)
WBC: 9.7 10*3/uL (ref 4.0–10.5)

## 2015-08-03 LAB — COMPREHENSIVE METABOLIC PANEL
ALBUMIN: 2.9 g/dL — AB (ref 3.5–5.0)
ALT: 18 U/L (ref 14–54)
ANION GAP: 10 (ref 5–15)
AST: 32 U/L (ref 15–41)
Alkaline Phosphatase: 103 U/L (ref 38–126)
BUN: 11 mg/dL (ref 6–20)
CHLORIDE: 96 mmol/L — AB (ref 101–111)
CO2: 23 mmol/L (ref 22–32)
Calcium: 8.9 mg/dL (ref 8.9–10.3)
Creatinine, Ser: 0.81 mg/dL (ref 0.44–1.00)
GFR calc Af Amer: >60 mL/min (ref >60)
Glucose, Bld: 122 mg/dL — ABNORMAL HIGH (ref 65–99)
POTASSIUM: 3.5 mmol/L (ref 3.5–5.1)
Sodium: 129 mmol/L — ABNORMAL LOW (ref 135–145)
Total Bilirubin: 0.5 mg/dL (ref 0.3–1.2)
Total Protein: 6.1 g/dL — ABNORMAL LOW (ref 6.5–8.1)

## 2015-08-03 LAB — TROPONIN I: TROPONIN I: 1.41 ng/mL — AB (ref <0.031)

## 2015-08-03 LAB — MRSA PCR SCREENING: MRSA BY PCR: NEGATIVE

## 2015-08-03 MED ORDER — ATORVASTATIN CALCIUM 80 MG PO TABS
80.0000 mg | ORAL_TABLET | Freq: Every day | ORAL | Status: DC
  Administered 2015-08-03 – 2015-08-07 (×5): 80 mg via ORAL
  Filled 2015-08-03 (×5): qty 1

## 2015-08-03 MED ORDER — PANTOPRAZOLE SODIUM 40 MG PO TBEC
40.0000 mg | DELAYED_RELEASE_TABLET | Freq: Every day | ORAL | Status: DC
  Administered 2015-08-04 – 2015-08-08 (×5): 40 mg via ORAL
  Filled 2015-08-03 (×5): qty 1

## 2015-08-03 MED ORDER — ZINC OXIDE 11.3 % EX CREA
TOPICAL_CREAM | Freq: Two times a day (BID) | CUTANEOUS | Status: DC
  Administered 2015-08-03 – 2015-08-04 (×2): via TOPICAL
  Administered 2015-08-05 – 2015-08-06 (×4): 1 via TOPICAL
  Administered 2015-08-07: 12:00:00 via TOPICAL
  Administered 2015-08-07: 1 via TOPICAL
  Administered 2015-08-08: 13:00:00 via TOPICAL
  Filled 2015-08-03 (×2): qty 56

## 2015-08-03 MED ORDER — MONTELUKAST SODIUM 10 MG PO TABS
10.0000 mg | ORAL_TABLET | Freq: Every day | ORAL | Status: DC
  Administered 2015-08-03 – 2015-08-08 (×6): 10 mg via ORAL
  Filled 2015-08-03 (×6): qty 1

## 2015-08-03 MED ORDER — GUAIFENESIN-CODEINE 100-10 MG/5ML PO SOLN: 10.0000 mL | Freq: Four times a day (QID) | ORAL | Status: DC | PRN

## 2015-08-03 MED ORDER — BENZONATATE 100 MG PO CAPS
200.0000 mg | ORAL_CAPSULE | Freq: Three times a day (TID) | ORAL | Status: DC | PRN
Start: 2015-08-03 — End: 2015-08-08
  Administered 2015-08-03 – 2015-08-07 (×8): 200 mg via ORAL
  Filled 2015-08-03 (×8): qty 2

## 2015-08-03 MED ORDER — ALBUTEROL SULFATE (2.5 MG/3ML) 0.083% IN NEBU
2.5000 mg | INHALATION_SOLUTION | Freq: Four times a day (QID) | RESPIRATORY_TRACT | Status: DC
  Administered 2015-08-04 – 2015-08-07 (×14): 2.5 mg via RESPIRATORY_TRACT
  Filled 2015-08-03 (×15): qty 3

## 2015-08-03 MED ORDER — ACETAMINOPHEN 325 MG PO TABS: 650.0000 mg | ORAL_TABLET | ORAL | Status: DC | PRN

## 2015-08-03 MED ORDER — NITROGLYCERIN IN D5W 200-5 MCG/ML-% IV SOLN
3.0000 ug/min | INTRAVENOUS | Status: DC
  Administered 2015-08-03: 10 ug/min via INTRAVENOUS

## 2015-08-03 MED ORDER — BUDESONIDE 0.25 MG/2ML IN SUSP
0.2500 mg | Freq: Two times a day (BID) | RESPIRATORY_TRACT | Status: DC
  Administered 2015-08-04 – 2015-08-08 (×9): 0.25 mg via RESPIRATORY_TRACT
  Filled 2015-08-03 (×9): qty 2

## 2015-08-03 MED ORDER — ONDANSETRON HCL 4 MG/2ML IJ SOLN: 4.0000 mg | Freq: Four times a day (QID) | INTRAMUSCULAR | Status: DC | PRN

## 2015-08-03 MED ORDER — ASPIRIN EC 81 MG PO TBEC
81.0000 mg | DELAYED_RELEASE_TABLET | Freq: Every day | ORAL | Status: DC
  Administered 2015-08-03 – 2015-08-05 (×3): 81 mg via ORAL
  Filled 2015-08-03 (×3): qty 1

## 2015-08-03 MED ORDER — HEPARIN BOLUS VIA INFUSION
4000.0000 [IU] | Freq: Once | INTRAVENOUS | Status: AC
  Administered 2015-08-03: 4000 [IU] via INTRAVENOUS
  Filled 2015-08-03: qty 4000

## 2015-08-03 MED ORDER — HEPARIN (PORCINE) IN NACL 100-0.45 UNIT/ML-% IJ SOLN
1000.0000 [IU]/h | INTRAMUSCULAR | Status: DC
  Administered 2015-08-03: 1000 [IU]/h via INTRAVENOUS
  Filled 2015-08-03 (×2): qty 250

## 2015-08-03 NOTE — Progress Notes (Signed)
ANTICOAGULATION CONSULT NOTE - Initial Consult  Pharmacy Consult for heparin  Indication: chest pain/ACS  Allergies  Allergen Reactions  . Carbocaine [Mepivacaine Hcl] Swelling  . Penicillins Swelling    Patient Measurements:   Heparin Dosing Weight: 75 kg  Vital Signs:    Labs: No results for input(s): HGB, HCT, PLT, APTT, LABPROT, INR, HEPARINUNFRC, HEPRLOWMOCWT, CREATININE, CKTOTAL, CKMB, TROPONINI in the last 72 hours.  CrCl cannot be calculated (Unknown ideal weight.).   Medical History: Past Medical History  Diagnosis Date  . Hypertension   . Hyperlipidemia   . COPD (chronic obstructive pulmonary disease)   . Depression   . IBS (irritable bowel syndrome)   . GERD (gastroesophageal reflux disease)   . Fibrocystic breast disease   . Asthma     Medications:  See EMR  Assessment: 75 yo female on Eliquis PTA for persistent AFib. Pt is now admitted with substernal chest pressure. Her last dose of Eliquis was yesterday evening. Will start pt on heparin gtt. CBC stable.  Goal of Therapy:  Heparin level 0.3-0.7 units/ml Monitor platelets by anticoagulation protocol: Yes   Plan:  -Heparin at 4000 units x1 then 1000 units/hr -Daily HL, CBC -First level in the AM -F/u cardiology plans for cath   Harvel Quale 08/03/2015,8:12 PM

## 2015-08-03 NOTE — H&P (Signed)
Patient ID: Heather Howard MRN: [id number], DOB/AGE: 12/30/1940   Admit date: 08/03/2015   Primary Physician: Lillard Anes, MD Primary Cardiologist: Dr. Stanford Breed  Pt. Profile:  75 year-old female with history of permanent atrial fibrillation on Eliquis, hypertension, hyperlipidemia, COPD and GERD presenting as a transfer from [facility] for non-ST elevation myocardial infarction.  Problem List  Past Medical History  Diagnosis Date  . Hypertension   . Hyperlipidemia   . COPD (chronic obstructive pulmonary disease)   . Depression   . IBS (irritable bowel syndrome)   . GERD (gastroesophageal reflux disease)   . Fibrocystic breast disease   . Asthma     Past Surgical History  Procedure Laterality Date  . Total knee arthroplasty Bilateral 2009  . Cesarean section    . Neck surgery    . Cholecystectomy    . Appendectomy       Allergies  Allergies  Allergen Reactions  . Carbocaine [Mepivacaine Hcl] Swelling  . Penicillins Swelling    HPI  Patient is a 75 year old female, followed by Dr. Stanford Breed, with a history of permanent atrial fibrillation, hypertension, hyperlipidemia, COPD and GERD who presents to [facility] as a transfer from [facility] for further workup in the setting of chest pain consistent with angina, elevated troponin and new right bundle branch block on EKG. There is no prior history of CAD.  She reports that she was in her usual state of health until last PM. She developed what she thought were URI symptoms. Sore throat/ burning sensation. Also with cough and body aches. No relief with OTC meds. Later today she developed substernal chest pressure radiating to her neck, both shoulder blades and bilateral jaws. Also with mild dyspnea. No syncope/ near syncope. Her symptoms concerned her, prompting her to seek emergency care.   Initial troponin at [facility] was abnormal at 0.57. Second troponin rose to 1.2. D-dimer was  elevated. Subsequent Chest CT was negative for PE but showed a 1 cm speculated mass in the right middle lobe and enlarged right para tracheal lymph nodes suspicious for malignancy. WBC elevated at 11K.  Other than a new right bundle branch block, there are no ST changes. Her atrial fibrillation is rate controlled. She has been on Eliquis for anticoagulation. Her last dose was last PM. She has been started on IV nitro. She is currently CP free.      Home Medications  Prior to Admission medications   Medication Sig Start Date End Date Taking? Authorizing Provider  apixaban (ELIQUIS) 5 MG TABS tablet Take 1 tablet (5 mg total) by mouth 2 (two) times daily. 06/22/15   Lelon Perla, MD  benzonatate (TESSALON) 200 MG capsule Take 200 mg by mouth as needed for cough.    Historical Provider, MD  Esomeprazole Magnesium (NEXIUM PO) Take 1 capsule by mouth as needed.    Historical Provider, MD  FLOVENT HFA 110 MCG/ACT inhaler Place 2 puffs into the nose 2 (two) times daily. 07/21/14   Historical Provider, MD  hydrOXYzine (ATARAX/VISTARIL) 25 MG tablet Take 25 mg by mouth as needed.    Historical Provider, MD  hyoscyamine (ANASPAZ) 0.125 MG TBDP disintergrating tablet Place 0.125 mg under the tongue as needed.    Historical Provider, MD  methocarbamol (ROBAXIN) 750 MG tablet Take 750 mg by mouth as needed for muscle spasms.    Historical Provider, MD  montelukast (SINGULAIR) 10 MG tablet Take 1 tablet by mouth daily. 08/31/14   Historical Provider,  MD  valsartan-hydrochlorothiazide (DIOVAN-HCT) 320-25 MG per tablet Take 1 tablet by mouth daily. 08/28/14   Historical Provider, MD  VENTOLIN HFA 108 (90 BASE) MCG/ACT inhaler 2 puffs 4 (four) times daily. 09/03/14   Historical Provider, MD    Family History  Family History  Problem Relation Age of Onset  . Diabetes Mellitus I Mother   . Heart disease Mother   . Thrombosis Father   . Diabetes Mother     Social History  Social History   Social History    . Marital Status: Married    Spouse Name: N/A  . Number of Children: 3  . Years of Education: N/A   Occupational History  . Not on file.   Social History Main Topics  . Smoking status: Former Smoker    Quit date: 09/20/1964  . Smokeless tobacco: Not on file  . Alcohol Use: 0.0 oz/week    0 Standard drinks or equivalent per week     Comment: Rare  . Drug Use: No  . Sexual Activity: Not on file   Other Topics Concern  . Not on file   Social History Narrative     Review of Systems General:  No chills, fever, night sweats or weight changes.  Cardiovascular:  No chest pain, dyspnea on exertion, edema, orthopnea, palpitations, paroxysmal nocturnal dyspnea. Dermatological: No rash, lesions/masses Respiratory: No cough, dyspnea Urologic: No hematuria, dysuria Abdominal:   No nausea, vomiting, diarrhea, bright red blood per rectum, melena, or hematemesis Neurologic:  No visual changes, wkns, changes in mental status. All other systems reviewed and are otherwise negative except as noted above.  Physical Exam  There were no vitals taken for this visit.  General: Pleasant, NAD Psych: Normal affect. Neuro: Alert and oriented X 3. Moves all extremities spontaneously. HEENT: Normal  Neck: Supple without bruits or JVD. Lungs:  Resp regular and unlabored, CTA. Heart: irregularly irregular no s3, s4, or murmurs. Abdomen: Soft, non-tender, non-distended, BS + x 4.  Extremities: No clubbing, cyanosis or edema. DP/PT/Radials 2+ and equal bilaterally.  Labs  Troponin (Point of Care Test) No results for input(s): TROPIPOC in the last 72 hours. No results for input(s): CKTOTAL, CKMB, TROPONINI in the last 72 hours. Lab Results  Component Value Date   WBC 7.6 09/21/2014   HGB 12.3 09/21/2014   HCT 37.5 09/21/2014   MCV 92.4 09/21/2014   PLT 419* 09/21/2014   No results for input(s): NA, K, CL, CO2, BUN, CREATININE, CALCIUM, PROT, BILITOT, ALKPHOS, ALT, AST, GLUCOSE in the last 168  hours.  Invalid input(s): LABALBU No results found for: CHOL, HDL, LDLCALC, TRIG No results found for: DDIMER   Radiology/Studies  No results found.  ECG  Atrial fibrillation with a CVR. New RBBB. No ST changes    ASSESSMENT AND PLAN  Principal Problem:   NSTEMI (non-ST elevated myocardial infarction) [facility]) Active Problems:   Atrial fibrillation (HCC)   Essential hypertension   COPD (chronic obstructive pulmonary disease) ([place])   1. NSTEMI: symptoms and enzyme trend is consistent with non-STEMI. She is currently CP free on IV nitro. She will need a LHC +/- PCI. She has been on Eliquis for a/c given permanent atrial fibrillation. Will need to hold for at least 3 doses. Her last dose was night of 08/02/15. Will plan for cath on Friday. Treat with IV heparin per pharmacy until cath. Will cycle cardiac enzymes x 3. 2D echo to assess LVF. Add high intensity statin.   2. Permanent Atrial fibrillation: ventricular  rate is well controlled. Embolic risk factors of age 85, hypertension, female sex and borderline diabetes mellitus = CHADS vasc score of 5.  Hold Eliquis for cath.   3. HTN: hold home valsartan/HCTZ for cath.   4. HLD: mentioned in [place] but not on a statin. No lipid panel on file. Given likelihood for CAD, will check a FLP in the am. Plan to treat with high intensity statin for now.   5. GERD: on Nexium at home. Will treat with Protonix while inpatient.   6. COPD: stable. Continue home inhalers.   7. Cough: patient also notes recent body aches. WBC also elevated. Will screen for flu.   8. Abnormal Chest CT: 1 cm speculated mass in the right middle lobe and enlarged right para tracheal lymph nodes suspicious for malignancy. Will need further w/u.    Signed, Lyda Jester, PA-C 08/03/2015, 7:03 PM

## 2015-08-04 ENCOUNTER — Inpatient Hospital Stay (HOSPITAL_COMMUNITY): Payer: Medicare HMO

## 2015-08-04 ENCOUNTER — Other Ambulatory Visit: Payer: Self-pay | Admitting: Acute Care

## 2015-08-04 ENCOUNTER — Encounter (HOSPITAL_COMMUNITY): Payer: Self-pay

## 2015-08-04 DIAGNOSIS — I214 Non-ST elevation (NSTEMI) myocardial infarction: Principal | ICD-10-CM

## 2015-08-04 DIAGNOSIS — R918 Other nonspecific abnormal finding of lung field: Secondary | ICD-10-CM

## 2015-08-04 DIAGNOSIS — R079 Chest pain, unspecified: Secondary | ICD-10-CM

## 2015-08-04 LAB — BASIC METABOLIC PANEL
ANION GAP: 10 (ref 5–15)
BUN: 9 mg/dL (ref 6–20)
CHLORIDE: 95 mmol/L — AB (ref 101–111)
CO2: 24 mmol/L (ref 22–32)
Calcium: 8.7 mg/dL — ABNORMAL LOW (ref 8.9–10.3)
Creatinine, Ser: 0.73 mg/dL (ref 0.44–1.00)
Glucose, Bld: 113 mg/dL — ABNORMAL HIGH (ref 65–99)
POTASSIUM: 3.4 mmol/L — AB (ref 3.5–5.1)
SODIUM: 129 mmol/L — AB (ref 135–145)

## 2015-08-04 LAB — CBC
HEMATOCRIT: 31.4 % — AB (ref 36.0–46.0)
Hemoglobin: 10 g/dL — ABNORMAL LOW (ref 12.0–15.0)
MCH: 29.2 pg (ref 26.0–34.0)
MCHC: 31.8 g/dL (ref 30.0–36.0)
MCV: 91.8 fL (ref 78.0–100.0)
PLATELETS: 352 10*3/uL (ref 150–400)
RBC: 3.42 MIL/uL — ABNORMAL LOW (ref 3.87–5.11)
RDW: 14.6 % (ref 11.5–15.5)
WBC: 8.2 10*3/uL (ref 4.0–10.5)

## 2015-08-04 LAB — LIPID PANEL
CHOL/HDL RATIO: 2.7 ratio
CHOLESTEROL: 136 mg/dL (ref 0–200)
HDL: 51 mg/dL (ref >40)
LDL Cholesterol: 78 mg/dL (ref 0–99)
TRIGLYCERIDES: 35 mg/dL (ref <150)
VLDL: 7 mg/dL (ref 0–40)

## 2015-08-04 LAB — PROTIME-INR
INR: 1.42 (ref 0.00–1.49)
Prothrombin Time: 17.4 seconds — ABNORMAL HIGH (ref 11.6–15.2)

## 2015-08-04 LAB — TROPONIN I
TROPONIN I: 0.94 ng/mL — AB (ref <0.031)
Troponin I: 1.05 ng/mL (ref <0.031)

## 2015-08-04 LAB — APTT
APTT: 73 s — AB (ref 24–37)
aPTT: 79 seconds — ABNORMAL HIGH (ref 24–37)

## 2015-08-04 LAB — HEPARIN LEVEL (UNFRACTIONATED): Heparin Unfractionated: 1.08 IU/mL — ABNORMAL HIGH (ref 0.30–0.70)

## 2015-08-04 MED ORDER — SODIUM CHLORIDE 0.9 % WEIGHT BASED INFUSION: 1.0000 mL/kg/h | INTRAVENOUS | Status: DC

## 2015-08-04 MED ORDER — SODIUM CHLORIDE 0.9 % WEIGHT BASED INFUSION
3.0000 mL/kg/h | INTRAVENOUS | Status: DC
  Administered 2015-08-05: 3 mL/kg/h via INTRAVENOUS

## 2015-08-04 MED ORDER — SODIUM CHLORIDE 0.9 % IV SOLN: 250.0000 mL | INTRAVENOUS | Status: DC | PRN

## 2015-08-04 MED ORDER — GUAIFENESIN-CODEINE 100-10 MG/5ML PO SOLN
10.0000 mL | Freq: Four times a day (QID) | ORAL | Status: DC | PRN
  Administered 2015-08-04 – 2015-08-06 (×3): 10 mL via ORAL
  Filled 2015-08-04 (×3): qty 10

## 2015-08-04 NOTE — Care Management Note (Signed)
Case Management Note  Patient Details  Name: Heather Howard MRN: [id number] Date of Birth: Nov 05, 1940  Subjective/Objective:       Adm w nstemi             Action/Plan: lives w  Husband, pcp dr Henrene Pastor   Expected Discharge Date:                  Expected Discharge Plan:     In-House Referral:     Discharge planning Services     Post Acute Care Choice:    Choice offered to:     DME Arranged:    DME Agency:     HH Arranged:    [place] Agency:     Status of Service:     Medicare Important Message Given:    Date Medicare IM Given:    Medicare IM give by:    Date Additional Medicare IM Given:    Additional Medicare Important Message give by:     If discussed at [place] of Stay Meetings, dates discussed:    Additional Comments: ur review done  Lacretia Leigh, RN 08/04/2015, 7:36 AM

## 2015-08-04 NOTE — Progress Notes (Signed)
  Echocardiogram 2D Echocardiogram has been performed.  Heather Howard 08/04/2015, 3:30 PM

## 2015-08-04 NOTE — Progress Notes (Signed)
CRITICAL VALUE ALERT  Critical value received:  Troponin 1.41  Date of notification:  08/03/15  Time of notification:  2130  Critical value read back: yes  Nurse who received alert:  Idelia Salm RN   MD notified: Cardiology Fellow aware, Pt is a NSTEMI scheduled to have a cath on Friday 2/17. Pt is on nitro and not having any cheat pain.

## 2015-08-04 NOTE — Consult Note (Signed)
Name: Heather Howard MRN: [id number] DOB: Mar 05, 1941    ADMISSION DATE:  08/03/2015 CONSULTATION DATE:  2/16  REFERRING MD :  Johnsie Cancel   CHIEF COMPLAINT:  Lung mass   BRIEF PATIENT DESCRIPTION:  This is a 50yof who was admitted as a transfer from [facility] for NSTEMI. During initial evaluation had CT chest to r/o PE. This was negative for PE but did identify new RML lung mass w/ Right paratracheal Lymphadenopathy. PCCM was asked to see in consult re: these findings.   SIGNIFICANT EVENTS   STUDIES:  Ct chest 2/15: negative for PE; RML spiculated lung mass ~ 1cm size; enlarged paratracheal LN on right    HISTORY OF PRESENT ILLNESS:   75 year old female who was in her usual state of health until the pm of 2/14. She developed what she thought were URI symptoms. Sore throat/ burning sensation. Also with cough and body aches. No relief with OTC meds. Later on 2/15 she developed substernal chest pressure radiating to her neck, both shoulder blades and bilateral jaws. Also with mild dyspnea. No syncope/ near syncope. Her symptoms concerned her, prompting her to seek emergency care.  Initial troponin at [facility] was abnormal at 0.57. Second troponin rose to 1.2. D-dimer was elevated. Subsequent Chest CT was negative for PE but showed a 1 cm speculated mass in the right middle lobe and enlarged right para tracheal lymph nodes suspicious for malignancy. WBC elevated at 11K. Other than a new right bundle branch block, there are no ST changes. Her atrial fibrillation is rate controlled. She has been on Eliquis for anticoagulation. She has been started on IV nitro. She is currently CP free. [facility] work-up. PCCM was asked to see Re: the CT chest findings.    PAST MEDICAL HISTORY :   has a past medical history of Hypertension; Hyperlipidemia; COPD (chronic obstructive pulmonary disease) ([place]); Depression; IBS (irritable bowel syndrome); GERD  (gastroesophageal reflux disease); Fibrocystic breast disease; and Asthma.  has past surgical history that includes Total knee arthroplasty (Bilateral, 2009); Cesarean section; Neck surgery; Cholecystectomy; and Appendectomy. Prior to Admission medications   Medication Sig Start Date End Date Taking? Authorizing Provider  alendronate (FOSAMAX) 70 MG tablet Take 70 mg by mouth once a week. Take with a full glass of water on an empty stomach.   Yes Historical Provider, MD  apixaban (ELIQUIS) 5 MG TABS tablet Take 1 tablet (5 mg total) by mouth 2 (two) times daily. 06/22/15  Yes Lelon Perla, MD  benzonatate (TESSALON) 200 MG capsule Take 200 mg by mouth as needed for cough.   Yes Historical Provider, MD  Esomeprazole Magnesium (NEXIUM PO) Take 1 capsule by mouth as needed.   Yes Historical Provider, MD  FLOVENT HFA 110 MCG/ACT inhaler Place 2 puffs into the nose 2 (two) times daily. 07/21/14  Yes Historical Provider, MD  hyoscyamine (ANASPAZ) 0.125 MG TBDP disintergrating tablet Place 0.125 mg under the tongue as needed.   Yes Historical Provider, MD  meloxicam (MOBIC) 7.5 MG tablet Take 7.5 mg by mouth 2 (two) times daily.   Yes Historical Provider, MD  methocarbamol (ROBAXIN) 750 MG tablet Take 750 mg by mouth as needed for muscle spasms.   Yes Historical Provider, MD  montelukast (SINGULAIR) 10 MG tablet Take 1 tablet by mouth daily. 08/31/14  Yes Historical Provider, MD  valsartan-hydrochlorothiazide (DIOVAN-HCT) 320-25 MG per tablet Take 1 tablet by mouth daily. 08/28/14  Yes Historical Provider,  MD  VENTOLIN HFA 108 (90 BASE) MCG/ACT inhaler 2 puffs 4 (four) times daily. 09/03/14  Yes Historical Provider, MD   Allergies  Allergen Reactions  . Carbocaine [Mepivacaine Hcl] Swelling  . Penicillins Swelling    FAMILY HISTORY:  family history includes Diabetes in her mother; Diabetes Mellitus I in her mother; Heart disease in her mother; Thrombosis in her father. SOCIAL HISTORY:  reports that  she quit smoking about 50 years ago. She does not have any smokeless tobacco history on file. She reports that she drinks alcohol. She reports that she does not use illicit drugs.  REVIEW OF SYSTEMS:   Constitutional: Negative for fever, chills, weight loss, malaise/fatigue and diaphoresis.  HENT: Negative for hearing loss, ear pain, nosebleeds, congestion, sore throat, neck pain, tinnitus and ear discharge.   Eyes: Negative for blurred vision, double vision, photophobia, pain, discharge and redness.  Respiratory: +cough, hemoptysis, no sputum production, shortness of breath, wheezing and stridor.   Cardiovascular: Negative for chest pain, palpitations, orthopnea, claudication, leg swelling and PND.  Gastrointestinal: Negative for heartburn, nausea, vomiting, abdominal pain, diarrhea, constipation, blood in stool and melena.  Genitourinary: Negative for dysuria, urgency, frequency, hematuria and flank pain.  Musculoskeletal: Negative for myalgias, back pain, joint pain and falls.  Skin: Negative for itching and rash.  Neurological: Negative for dizziness, tingling, tremors, sensory change, speech change, focal weakness, seizures, loss of consciousness, weakness and headaches.  Endo/Heme/Allergies: Negative for environmental allergies and polydipsia. Does not bruise/bleed easily.  SUBJECTIVE:  No distress  VITAL SIGNS: Temp:  [97.9 F (36.6 C)-98.4 F (36.9 C)] 97.9 F (36.6 C) (02/16 0700) Pulse Rate:  [62-83] 62 (02/16 0700) Resp:  [12-19] 15 (02/16 0700) BP: (104-136)/(56-91) 105/66 mmHg (02/16 0700) SpO2:  [98 %-100 %] 98 % (02/16 0700) Weight:  [190 lb 14.7 oz (86.6 kg)] 190 lb 14.7 oz (86.6 kg) (02/16 0500)  PHYSICAL EXAMINATION: General:  75 year old female, resting comfortably in bed. No distress Neuro:  Awake, oriented. No focal def  HEENT:  NCAT, no JVD, MMM Cardiovascular:  rrr w/out MRG Lungs:  Clear and w/out accessory muscle use  Abdomen:  Soft, not tender, + bowel  sounds Musculoskeletal:  Intact w/ equal st and bulk  Skin:  Warm and dry    Recent Labs Lab 08/03/15 2030 08/04/15 0805  NA 129* 129*  K 3.5 3.4*  CL 96* 95*  CO2 23 24  BUN 11 9  CREATININE 0.81 0.73  GLUCOSE 122* 113*    Recent Labs Lab 08/03/15 2030 08/04/15 0420  HGB 10.1* 10.0*  HCT 31.5* 31.4*  WBC 9.7 8.2  PLT 357 352   No results found.  ASSESSMENT / PLAN: Right Middle lobe Lung Mass w/ possible associated LA in the right paratracheal region Plan Will set her up for PET scan f/b appointment at our office. [place] March 14th 215pm, Will need Endobronchial [place] for tissue bx after cleared from cardiac stand-point   NSTEMI Plan Per cards  Afib Plan Per cards  Possible COPD Plan Cont current rx.   Erick Colace ACNP-BC [place] Pager # [phone] OR # [phone] if no answer  08/04/2015, 9:18 AM

## 2015-08-04 NOTE — Progress Notes (Signed)
ANTICOAGULATION CONSULT NOTE - Follow Up Consult  Pharmacy Consult for Heparin  Indication: chest pain/ACS  Allergies  Allergen Reactions  . Carbocaine [Mepivacaine Hcl] Swelling  . Penicillins Swelling    Patient Measurements: Weight: 190 lb 14.7 oz (86.6 kg)  Vital Signs: Temp: 98.4 F (36.9 C) (02/16 0400) Temp Source: Oral (02/16 0400) BP: 109/56 mmHg (02/16 0400) Pulse Rate: 71 (02/16 0400)  Labs:  Recent Labs  08/03/15 2030 08/04/15 0145 08/04/15 0420  HGB 10.1*  --  10.0*  HCT 31.5*  --  31.4*  PLT 357  --  352  APTT  --   --  73*  LABPROT  --   --  17.4*  INR  --   --  1.42  HEPARINUNFRC  --   --  1.08*  CREATININE 0.81  --   --   TROPONINI 1.41* 1.05*  --     CrCl cannot be calculated (Unknown ideal weight.).   Assessment: Heparin for chest pressure, initial aPTT is therapeutic at 73, using aPTT to dose heparin for now given Apixaban influence on anti-Xa levels.   Goal of Therapy:  Heparin level 0.3-0.7 units/ml aPTT 66-102 seconds Monitor platelets by anticoagulation protocol: Yes   Plan:  -Continue heparin at 1000 units/hr -1200 aPTT  Narda Bonds 08/04/2015,5:08 AM

## 2015-08-04 NOTE — Progress Notes (Signed)
ANTICOAGULATION CONSULT NOTE - Follow Up Consult  Pharmacy Consult for Heparin  Indication: chest pain/ACS  Allergies  Allergen Reactions  . Carbocaine [Mepivacaine Hcl] Swelling  . Penicillins Swelling    Patient Measurements: Height: 5\' 2"  (157.5 cm) Weight: 190 lb 14.7 oz (86.6 kg) IBW/kg (Calculated) : 50.1  Vital Signs: Temp: 97.7 F (36.5 C) (02/16 1100) Temp Source: Oral (02/16 1100) BP: 108/61 mmHg (02/16 1200) Pulse Rate: 68 (02/16 1200)  Labs:  Recent Labs  08/03/15 2030 08/04/15 0145 08/04/15 0420 08/04/15 0805 08/04/15 1045  HGB 10.1*  --  10.0*  --   --   HCT 31.5*  --  31.4*  --   --   PLT 357  --  352  --   --   APTT  --   --  73*  --  79*  LABPROT  --   --  17.4*  --   --   INR  --   --  1.42  --   --   HEPARINUNFRC  --   --  1.08*  --   --   CREATININE 0.81  --   --  0.73  --   TROPONINI 1.41* 1.05*  --  0.94*  --     Estimated Creatinine Clearance: 62.1 mL/min (by C-G formula based on Cr of 0.73).   Assessment: Heparin for chest pressure, confirmatory aPTT is therapeutic at 79, using aPTT to dose heparin for now given Apixaban influence on anti-Xa levels.   Goal of Therapy:  Heparin level 0.3-0.7 units/ml aPTT 66-102 seconds Monitor platelets by anticoagulation protocol: Yes   Plan:  -Continue heparin at 1000 units/hr -AM aPTT/HL/CBC -Monitor for s/s of bleeding   Zaylen Susman C. Lennox Grumbles, PharmD Pharmacy Resident  Pager: [phone] 08/04/2015 1:47 PM

## 2015-08-04 NOTE — Progress Notes (Signed)
Patient Name: Vista Leazer Hippe Date of Encounter: 08/04/2015   SUBJECTIVE  Feeling well. No chest pain, sob or palpitations. The patient has severe indoor/outdoor and food allergies.   CURRENT MEDS . albuterol  2.5 mg Inhalation QID  . aspirin EC  81 mg Oral Daily  . atorvastatin  80 mg Oral q1800  . budesonide  0.25 mg Nebulization BID  . montelukast  10 mg Oral Daily  . pantoprazole  40 mg Oral Daily  . zinc oxide   Topical BID    OBJECTIVE  Filed Vitals:   08/04/15 0400 08/04/15 0500 08/04/15 0600 08/04/15 0700  BP: 109/56 111/69 125/69 105/66  Pulse: 71 83 66 62  Temp: 98.4 F (36.9 C)     TempSrc: Oral     Resp: 12 18 13 15   Height:  5\' 2"  (1.575 m)    Weight:  190 lb 14.7 oz (86.6 kg)    SpO2: 99% 99% 98% 98%    Intake/Output Summary (Last 24 hours) at 08/04/15 0753 Last data filed at 08/04/15 0700  Gross per 24 hour  Intake     30 ml  Output    550 ml  Net   -520 ml   Filed Weights   08/03/15 2000 08/04/15 0500  Weight: 190 lb 14.7 oz (86.6 kg) 190 lb 14.7 oz (86.6 kg)    PHYSICAL EXAM  General: Pleasant, NAD. Neuro: Alert and oriented X 3. Moves all extremities spontaneously. Psych: Normal affect. HEENT:  Normal  Neck: Supple without bruits or JVD. Lungs:  Resp regular and unlabored, CTA. Heart: ir ir  no s3, s4, or murmurs. Abdomen: Soft, non-tender, non-distended, BS + x 4.  Extremities: No clubbing, cyanosis or edema. DP/PT/Radials 2+ and equal bilaterally.  Accessory Clinical Findings  CBC  Recent Labs  08/03/15 2030 08/04/15 0420  WBC 9.7 8.2  NEUTROABS 7.6  --   HGB 10.1* 10.0*  HCT 31.5* 31.4*  MCV 91.6 91.8  PLT 357 [id number]   Basic Metabolic Panel  Recent Labs  08/03/15 2030  NA 129*  K 3.5  CL 96*  CO2 23  GLUCOSE 122*  BUN 11  CREATININE 0.81  CALCIUM 8.9   Liver Function Tests  Recent Labs  08/03/15 2030  AST 32  ALT 18  ALKPHOS 103  BILITOT 0.5  PROT 6.1*  ALBUMIN 2.9*   No results for input(s): LIPASE,  AMYLASE in the last 72 hours. Cardiac Enzymes  Recent Labs  08/03/15 2030 08/04/15 0145  TROPONINI 1.41* 1.05*   Fasting Lipid Panel  Recent Labs  08/04/15 0420  CHOL 136  HDL 51  LDLCALC 78  TRIG 35  CHOLHDL 2.7   TELE  afib Radiology/Studies  No results found.  ASSESSMENT AND PLAN  1. NSTEMI: symptoms and enzyme trend is consistent with non-STEMI. She is currently CP free on IV nitro.  She has been on Eliquis for a/c given permanent atrial fibrillation. Her last dose was night of 08/02/15. Treat with IV heparin per pharmacy until cath.  Troponin 1.41-->1.05.  Pending 2D echo to assess LVF.  - Likely stated cath/PCI until r/o lung malignancy.  Will plan for cath Friday.   2. Permanent Atrial fibrillation: ventricular rate is well controlled. Embolic risk factors of age 60, hypertension, female sex and borderline diabetes mellitus = CHADS vasc score of 5. Hold Eliquis for cath.   3. HTN: hold home valsartan/HCTZ for cath.   4. HLD: Statin added during admission.  08/04/2015: Cholesterol 136; HDL  51; LDL Cholesterol 78; Triglycerides 35; VLDL 7  5. GERD: on Nexium at home. Will treat with Protonix while inpatient.   6. COPD: stable. Continue home inhalers.   7. Cough: patient also notes recent body aches. WBC also elevated. Pending flu screen.   8. Abnormal Chest CT: 1 cm speculated mass in the right middle lobe and enlarged right para tracheal lymph nodes suspicious for malignancy. Pulmonary vs thoracici surgery vs oncology consult. Will discuss with MD.   9. Allergies - indoor/outdoor and food allergies. Advise to avoid trigger. Continue Singulair.    Jarrett Soho PA-C Pager [phone]  Patient examined chart reviewed.  Called lab to schedule cath for tomorrow Earliest slot Is 3:00.  See note by Dr Debara Pickett likely will be diagnostic only Eliquis on hold.  Will ask Pulmonary to see to further w/u lung cancer.  No pain this am  Daughter will be  coming From [place]

## 2015-08-05 ENCOUNTER — Encounter (HOSPITAL_COMMUNITY)
Admission: AD | Disposition: A | Discharge status: Home / Self Care | Payer: Self-pay | Source: Other Acute Inpatient Hospital | Attending: Cardiology

## 2015-08-05 ENCOUNTER — Encounter (HOSPITAL_COMMUNITY): Payer: Self-pay | Admitting: Cardiology

## 2015-08-05 DIAGNOSIS — R911 Solitary pulmonary nodule: Secondary | ICD-10-CM | POA: Diagnosis present

## 2015-08-05 DIAGNOSIS — E876 Hypokalemia: Secondary | ICD-10-CM

## 2015-08-05 DIAGNOSIS — D649 Anemia, unspecified: Secondary | ICD-10-CM

## 2015-08-05 DIAGNOSIS — I2510 Atherosclerotic heart disease of native coronary artery without angina pectoris: Secondary | ICD-10-CM

## 2015-08-05 HISTORY — PX: CARDIAC CATHETERIZATION: SHX172

## 2015-08-05 LAB — HEMOGLOBIN A1C
Hgb A1c MFr Bld: 5.9 % — ABNORMAL HIGH (ref 4.8–5.6)
MEAN PLASMA GLUCOSE: 123 mg/dL

## 2015-08-05 LAB — HEPARIN LEVEL (UNFRACTIONATED): HEPARIN UNFRACTIONATED: 0.64 [IU]/mL (ref 0.30–0.70)

## 2015-08-05 LAB — BASIC METABOLIC PANEL
ANION GAP: 10 (ref 5–15)
BUN: 9 mg/dL (ref 6–20)
CALCIUM: 8.5 mg/dL — AB (ref 8.9–10.3)
CO2: 25 mmol/L (ref 22–32)
Chloride: 98 mmol/L — ABNORMAL LOW (ref 101–111)
Creatinine, Ser: 0.74 mg/dL (ref 0.44–1.00)
GFR calc Af Amer: >60 mL/min (ref >60)
GLUCOSE: 112 mg/dL — AB (ref 65–99)
Potassium: 3.4 mmol/L — ABNORMAL LOW (ref 3.5–5.1)
Sodium: 133 mmol/L — ABNORMAL LOW (ref 135–145)

## 2015-08-05 LAB — CBC
HCT: 30.1 % — ABNORMAL LOW (ref 36.0–46.0)
HEMOGLOBIN: 9.5 g/dL — AB (ref 12.0–15.0)
MCH: 29.1 pg (ref 26.0–34.0)
MCHC: 31.6 g/dL (ref 30.0–36.0)
MCV: 92.3 fL (ref 78.0–100.0)
Platelets: 330 10*3/uL (ref 150–400)
RBC: 3.26 MIL/uL — ABNORMAL LOW (ref 3.87–5.11)
RDW: 15 % (ref 11.5–15.5)
WBC: 7.1 10*3/uL (ref 4.0–10.5)

## 2015-08-05 LAB — RESPIRATORY VIRUS PANEL
Adenovirus: NEGATIVE
INFLUENZA A: NEGATIVE
Influenza B: NEGATIVE
Metapneumovirus: NEGATIVE
Parainfluenza 1: NEGATIVE
Parainfluenza 2: NEGATIVE
Parainfluenza 3: NEGATIVE
RESPIRATORY SYNCYTIAL VIRUS A: NEGATIVE
RESPIRATORY SYNCYTIAL VIRUS B: NEGATIVE
Rhinovirus: POSITIVE — AB

## 2015-08-05 LAB — GLUCOSE, CAPILLARY
GLUCOSE-CAPILLARY: 103 mg/dL — AB (ref 65–99)
Glucose-Capillary: 94 mg/dL (ref 65–99)
Glucose-Capillary: 120 mg/dL — ABNORMAL HIGH (ref 65–99)

## 2015-08-05 LAB — APTT: aPTT: 74 seconds — ABNORMAL HIGH (ref 24–37)

## 2015-08-05 LAB — POCT ACTIVATED CLOTTING TIME
ACTIVATED CLOTTING TIME: 281 s
Activated Clotting Time: 281 seconds

## 2015-08-05 SURGERY — LEFT HEART CATH AND CORONARY ANGIOGRAPHY

## 2015-08-05 MED ORDER — ADENOSINE (DIAGNOSTIC) 140MCG/KG/MIN
INTRAVENOUS | Status: DC | PRN
  Administered 2015-08-05: 140 ug/kg/min via INTRAVENOUS

## 2015-08-05 MED ORDER — SODIUM CHLORIDE 0.9% FLUSH: 3.0000 mL | INTRAVENOUS | Status: DC | PRN

## 2015-08-05 MED ORDER — SODIUM CHLORIDE 0.9% FLUSH
3.0000 mL | Freq: Two times a day (BID) | INTRAVENOUS | Status: DC
  Administered 2015-08-05 – 2015-08-07 (×4): 3 mL via INTRAVENOUS

## 2015-08-05 MED ORDER — ONDANSETRON HCL 4 MG/2ML IJ SOLN: 4.0000 mg | Freq: Four times a day (QID) | INTRAMUSCULAR | Status: DC | PRN

## 2015-08-05 MED ORDER — ASPIRIN 81 MG PO CHEW
81.0000 mg | CHEWABLE_TABLET | ORAL | Status: AC
  Administered 2015-08-05: 81 mg via ORAL
  Filled 2015-08-05: qty 1

## 2015-08-05 MED ORDER — SODIUM CHLORIDE 0.9 % IV SOLN: 4.0000 ug/kg/min | INTRAVENOUS | Status: AC

## 2015-08-05 MED ORDER — FENTANYL CITRATE (PF) 100 MCG/2ML IJ SOLN
INTRAMUSCULAR | Status: AC
  Filled 2015-08-05: qty 2

## 2015-08-05 MED ORDER — MIDAZOLAM HCL 2 MG/2ML IJ SOLN
INTRAMUSCULAR | Status: DC | PRN
  Administered 2015-08-05 (×2): 1 mg via INTRAVENOUS

## 2015-08-05 MED ORDER — SODIUM CHLORIDE 0.9 % WEIGHT BASED INFUSION: 1.0000 mL/kg/h | INTRAVENOUS | Status: AC

## 2015-08-05 MED ORDER — SODIUM CHLORIDE 0.9 % IV SOLN
50000.0000 ug | INTRAVENOUS | Status: DC | PRN
  Administered 2015-08-05: 0.75 ug/kg/min via INTRAVENOUS

## 2015-08-05 MED ORDER — TICAGRELOR 90 MG PO TABS
ORAL_TABLET | ORAL | Status: AC
  Filled 2015-08-05: qty 2

## 2015-08-05 MED ORDER — NITROGLYCERIN 1 MG/10 ML FOR IR/CATH LAB
INTRA_ARTERIAL | Status: AC
  Filled 2015-08-05: qty 10

## 2015-08-05 MED ORDER — TICAGRELOR 90 MG PO TABS
90.0000 mg | ORAL_TABLET | Freq: Two times a day (BID) | ORAL | Status: DC
  Administered 2015-08-05: 90 mg via ORAL
  Filled 2015-08-05: qty 1

## 2015-08-05 MED ORDER — NITROGLYCERIN 1 MG/10 ML FOR IR/CATH LAB
INTRA_ARTERIAL | Status: DC | PRN
  Administered 2015-08-05: 200 ug via INTRACORONARY

## 2015-08-05 MED ORDER — HEPARIN SODIUM (PORCINE) 1000 UNIT/ML IJ SOLN
INTRAMUSCULAR | Status: AC
  Filled 2015-08-05: qty 1

## 2015-08-05 MED ORDER — SODIUM CHLORIDE 0.9% FLUSH: 3.0000 mL | Freq: Two times a day (BID) | INTRAVENOUS | Status: DC

## 2015-08-05 MED ORDER — POTASSIUM CHLORIDE CRYS ER 20 MEQ PO TBCR
40.0000 meq | EXTENDED_RELEASE_TABLET | Freq: Once | ORAL | Status: AC
  Administered 2015-08-05: 40 meq via ORAL
  Filled 2015-08-05: qty 2

## 2015-08-05 MED ORDER — MIDAZOLAM HCL 2 MG/2ML IJ SOLN
INTRAMUSCULAR | Status: AC
Start: 2015-08-05 — End: 2015-08-05
  Filled 2015-08-05: qty 2

## 2015-08-05 MED ORDER — ASPIRIN 81 MG PO CHEW
81.0000 mg | CHEWABLE_TABLET | Freq: Every day | ORAL | Status: DC
  Administered 2015-08-06 – 2015-08-08 (×3): 81 mg via ORAL
  Filled 2015-08-05 (×3): qty 1

## 2015-08-05 MED ORDER — ACETAMINOPHEN 325 MG PO TABS
650.0000 mg | ORAL_TABLET | ORAL | Status: DC | PRN
  Administered 2015-08-06: 650 mg via ORAL
  Filled 2015-08-05: qty 2

## 2015-08-05 MED ORDER — HEPARIN (PORCINE) IN NACL 100-0.45 UNIT/ML-% IJ SOLN
1000.0000 [IU]/h | INTRAMUSCULAR | Status: DC
  Administered 2015-08-06 – 2015-08-07 (×2): 1000 [IU]/h via INTRAVENOUS
  Filled 2015-08-05 (×2): qty 250

## 2015-08-05 MED ORDER — HEPARIN (PORCINE) IN NACL 2-0.9 UNIT/ML-% IJ SOLN
INTRAMUSCULAR | Status: DC | PRN
Start: 2015-08-05 — End: 2015-08-05
  Administered 2015-08-05: 1000 mL

## 2015-08-05 MED ORDER — IOHEXOL 350 MG/ML SOLN
INTRAVENOUS | Status: DC | PRN
  Administered 2015-08-05: 170 mL via INTRAVENOUS

## 2015-08-05 MED ORDER — LIDOCAINE HCL (PF) 1 % IJ SOLN
INTRAMUSCULAR | Status: DC | PRN
Start: 2015-08-05 — End: 2015-08-05
  Administered 2015-08-05: 2 mL

## 2015-08-05 MED ORDER — HEPARIN SODIUM (PORCINE) 5000 UNIT/ML IJ SOLN: 5000.0000 [IU] | Freq: Three times a day (TID) | INTRAMUSCULAR | Status: DC

## 2015-08-05 MED ORDER — HEPARIN (PORCINE) IN NACL 2-0.9 UNIT/ML-% IJ SOLN
INTRAMUSCULAR | Status: AC
  Filled 2015-08-05: qty 1000

## 2015-08-05 MED ORDER — CANGRELOR BOLUS VIA INFUSION
INTRAVENOUS | Status: DC | PRN
  Administered 2015-08-05: 2574 ug via INTRAVENOUS

## 2015-08-05 MED ORDER — FENTANYL CITRATE (PF) 100 MCG/2ML IJ SOLN
INTRAMUSCULAR | Status: DC | PRN
  Administered 2015-08-05 (×3): 25 ug via INTRAVENOUS

## 2015-08-05 MED ORDER — VERAPAMIL HCL 2.5 MG/ML IV SOLN
INTRAVENOUS | Status: AC
  Filled 2015-08-05: qty 2

## 2015-08-05 MED ORDER — BUPIVACAINE HCL (PF) 0.25 % IJ SOLN
INTRAMUSCULAR | Status: AC
  Filled 2015-08-05: qty 30

## 2015-08-05 MED ORDER — ADENOSINE 12 MG/4ML IV SOLN
16.0000 mL | Freq: Once | INTRAVENOUS | Status: DC
  Filled 2015-08-05: qty 16

## 2015-08-05 MED ORDER — CANGRELOR TETRASODIUM 50 MG IV SOLR
INTRAVENOUS | Status: AC
  Filled 2015-08-05: qty 50

## 2015-08-05 MED ORDER — VERAPAMIL HCL 2.5 MG/ML IV SOLN
INTRAVENOUS | Status: DC | PRN
  Administered 2015-08-05: 14:00:00 via INTRA_ARTERIAL

## 2015-08-05 MED ORDER — TICAGRELOR 90 MG PO TABS
ORAL_TABLET | ORAL | Status: DC | PRN
  Administered 2015-08-05: 180 mg via ORAL

## 2015-08-05 MED ORDER — HEPARIN SODIUM (PORCINE) 1000 UNIT/ML IJ SOLN
INTRAMUSCULAR | Status: DC | PRN
  Administered 2015-08-05: 4000 [IU] via INTRAVENOUS
  Administered 2015-08-05: 2000 [IU] via INTRAVENOUS
  Administered 2015-08-05: 6000 [IU] via INTRAVENOUS
  Administered 2015-08-05: 2000 [IU] via INTRAVENOUS

## 2015-08-05 MED ORDER — LIDOCAINE HCL (PF) 1 % IJ SOLN
INTRAMUSCULAR | Status: AC
  Filled 2015-08-05: qty 30

## 2015-08-05 MED ORDER — SODIUM CHLORIDE 0.9 % IV SOLN: 250.0000 mL | INTRAVENOUS | Status: DC | PRN

## 2015-08-05 SURGICAL SUPPLY — 23 items
BALLN ANGIOSCULPT RX 3.0X15 (BALLOONS) ×3
BALLN ~~LOC~~ EUPHORA RX 3.75X12 (BALLOONS) ×3
BALLOON ANGIOSCULPT RX 3.0X15 (BALLOONS) IMPLANT
BALLOON ~~LOC~~ EUPHORA RX 3.75X12 (BALLOONS) IMPLANT
CATH INFINITI 5 FR JL3.5 (CATHETERS) ×3 IMPLANT
CATH INFINITI 5FR ANG PIGTAIL (CATHETERS) ×3 IMPLANT
CATH INFINITI JR4 5F (CATHETERS) ×1 IMPLANT
CATH MICROCATH NAVVUS (MICROCATHETER) IMPLANT
DEVICE RAD COMP TR BAND LRG (VASCULAR PRODUCTS) ×3 IMPLANT
GLIDESHEATH SLEND SS 6F .021 (SHEATH) ×3 IMPLANT
GUIDE CATH RUNWAY 6FR CLS3 (CATHETERS) ×1 IMPLANT
KIT ENCORE 26 ADVANTAGE (KITS) ×1 IMPLANT
KIT HEART LEFT (KITS) ×3 IMPLANT
MICROCATHETER NAVVUS (MICROCATHETER) ×3
PACK CARDIAC CATHETERIZATION (CUSTOM PROCEDURE TRAY) ×3 IMPLANT
STENT VISION RX 3.5X15 (Permanent Stent) ×1 IMPLANT
SYR MEDRAD MARK V 150ML (SYRINGE) ×3 IMPLANT
TRANSDUCER W/STOPCOCK (MISCELLANEOUS) ×3 IMPLANT
TUBING CIL FLEX 10 FLL-RA (TUBING) ×3 IMPLANT
VALVE GUARDIAN II ~~LOC~~ HEMO (MISCELLANEOUS) ×1 IMPLANT
WIRE ASAHI PROWATER 180CM (WIRE) ×1 IMPLANT
WIRE HI TORQ VERSACORE-J 145CM (WIRE) ×1 IMPLANT
WIRE SAFE-T 1.5MM-J .035X260CM (WIRE) ×4 IMPLANT

## 2015-08-05 NOTE — Progress Notes (Signed)
ANTICOAGULATION CONSULT NOTE - Follow Up Consult  Pharmacy Consult for Heparin Indication: NSTEMI  Allergies  Allergen Reactions  . Carbocaine [Mepivacaine Hcl] Swelling  . Penicillins Swelling    Patient Measurements: Height: 5\' 2"  (157.5 cm) Weight: 189 lb 3.2 oz (85.821 kg) IBW/kg (Calculated) : 50.1 Heparin Dosing Weight: 69.5kg  Vital Signs: Temp: 98.7 F (37.1 C) (02/17 0700) Temp Source: Oral (02/17 0700) BP: 105/68 mmHg (02/17 1000) Pulse Rate: 75 (02/17 1100)  Labs:  Recent Labs  08/03/15 2030 08/04/15 0145 08/04/15 0420 08/04/15 0805 08/04/15 1045 08/05/15 0325  HGB 10.1*  --  10.0*  --   --  9.5*  HCT 31.5*  --  31.4*  --   --  30.1*  PLT 357  --  352  --   --  330  APTT  --   --  73*  --  79* 74*  LABPROT  --   --  17.4*  --   --   --   INR  --   --  1.42  --   --   --   HEPARINUNFRC  --   --  1.08*  --   --  0.64  CREATININE 0.81  --   --  0.73  --  0.74  TROPONINI 1.41* 1.05*  --  0.94*  --   --     Estimated Creatinine Clearance: 61.8 mL/min (by C-G formula based on Cr of 0.74).   Medications:  Heparin @ 1000 units/hr  Assessment: 74yof on eliquis pta, transitioned to IV heparin for NSTEMI. Heparin level and aPTT are therapeutic and correlating. CBC stable. No bleeding reported. Plan for cath today.  Goal of Therapy:  APTT 66-102 seconds Heparin level 0.3-0.7 units/ml Monitor platelets by anticoagulation protocol: Yes   Plan:  1) Continue heparin at 1000 units/hr 2) Follow up after cath  Deboraha Sprang 08/05/2015,11:08 AM

## 2015-08-05 NOTE — Progress Notes (Signed)
Pt. Profile:  75 year-old female with history of permanent atrial fibrillation on Eliquis, hypertension, hyperlipidemia, COPD and GERD presenting as a transfer from [facility] for non-ST elevation myocardial infarction.   Subjective: No chest pain, no SOB + coughing.  A fib rate controlled.  Objective: Vital signs in last 24 hours: Temp:  [97.5 F (36.4 C)-99.2 F (37.3 C)] 97.5 F (36.4 C) (02/17 0400) Pulse Rate:  [56-92] 72 (02/17 0600) Resp:  [16-23] 22 (02/17 0600) BP: (82-124)/(39-67) 114/54 mmHg (02/17 0600) SpO2:  [92 %-100 %] 97 % (02/17 0600) Weight:  [189 lb 3.2 oz (85.821 kg)] 189 lb 3.2 oz (85.821 kg) (02/17 0400) Weight change: -1 lb 11.5 oz (-0.779 kg) Last BM Date: 08/04/15 Intake/Output from previous day: -918 02/16 0701 - 02/17 0700 In: 1251.8 [P.O.:680; I.V.:571.8] Out: 1900 [Urine:1900] Intake/Output this shift:    PE: General:Pleasant affect, NAD Skin:Warm and dry, brisk capillary refill HEENT:normocephalic, sclera clear, mucus membranes moist Neck:supple, no JVD, no bruits  Heart:irreg irreg without murmur, gallup, rub or click Lungs:diminished without rales, rhonchi, or wheezes- just did nebulizer [id number], non tender, + BS, do not palpate liver spleen or masses Ext:no lower ext edema, 2+ pedal pulses, 2+ radial pulses Neuro:alert and oriented X 3, MAE, follows commands, + facial symmetry Tele: a fib rate controlled   Lab Results:  Recent Labs  08/04/15 0420 08/05/15 0325  WBC 8.2 7.1  HGB 10.0* 9.5*  HCT 31.4* 30.1*  PLT 352 330   BMET  Recent Labs  08/04/15 0805 08/05/15 0325  NA 129* 133*  K 3.4* 3.4*  CL 95* 98*  CO2 24 25  GLUCOSE 113* 112*  BUN 9 9  CREATININE 0.73 0.74  CALCIUM 8.7* 8.5*    Recent Labs  08/04/15 0145 08/04/15 0805  TROPONINI 1.05* 0.94*    Lab Results  Component Value Date   CHOL 136 08/04/2015   HDL 51 08/04/2015   LDLCALC 78 08/04/2015   TRIG 35 08/04/2015   CHOLHDL 2.7  08/04/2015   Lab Results  Component Value Date   HGBA1C 5.9* 08/03/2015     Lab Results  Component Value Date   TSH 1.674 09/21/2014    Hepatic Function Panel  Recent Labs  08/03/15 2030  PROT 6.1*  ALBUMIN 2.9*  AST 32  ALT 18  ALKPHOS 103  BILITOT 0.5    Recent Labs  08/04/15 0420  CHOL 136   No results for input(s): PROTIME in the last 72 hours.     Studies/Results: ECHO: Study Conclusions  - Left ventricle: The cavity size was normal. Systolic function was moderately reduced. The estimated ejection fraction was in the range of 35% to 40%. There is akinesis of the mid inferoseptal, apical septal and apical myocardium. There is akinesis of the mid anteroseptal and apical anteriorl myocardium. . There is akinesis of the apicallateral myocardium. There is akinesis of the mid-apicalinferolateral and inferior myocardium. - Aortic valve: There was mild regurgitation. - Mitral valve: There was mild regurgitation. - Left atrium: The atrium was mildly dilated. - Right ventricle: There is akinesis of the mid and apical portions of the RV. - Pulmonary arteries: PA peak pressure: 40 mm Hg (S).  Impressions:  - The right ventricular systolic pressure was increased consistent with moderate pulmonary hypertension.  Medications: I have reviewed the patient's current medications. Scheduled Meds: . albuterol  2.5 mg Inhalation QID  . aspirin EC  81 mg Oral Daily  . atorvastatin  80  mg Oral q1800  . budesonide  0.25 mg Nebulization BID  . montelukast  10 mg Oral Daily  . pantoprazole  40 mg Oral Daily  . zinc oxide   Topical BID   Continuous Infusions: . sodium chloride 1 mL/kg/hr (08/05/15 0700)  . heparin 1,000 Units/hr (08/04/15 2000)  . nitroGLYCERIN 10 mcg/min (08/04/15 2000)   PRN Meds:.sodium chloride, acetaminophen, benzonatate, guaiFENesin-codeine, ondansetron (ZOFRAN) IV  Assessment/Plan: Principal Problem:   NSTEMI (non-ST elevated  myocardial infarction) (HCC)--symptoms and enzyme trend is consistent with non-STEMI. She is currently CP free on IV nitro. She has been on Eliquis for a/c given permanent atrial fibrillation. Her last dose was night of 08/02/15. Treat with IV heparin per pharmacy until cath. Troponin 1.41-->1.05.  --- for cardiac cath today NPO after clear liquid, she was able to lie flat though still with cough at times.  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    Essential hypertension--currently more hypotensive on low dose NTG   COPD (chronic obstructive pulmonary disease) (HCC)   Lung nodule Rt middle lobe- Pulmonary has seen - plan for outpt eval with Bronch and PET scan.  Would need to be off ASA and Plavix for procedure.     Anemia  9.5/30 on admit 10.1/31/5    Hypokalemia will replace.   LOS: 2 days   Time spent with pt. :15 minutes. [facility] R  Nurse Practitioner Certified Pager [phone] or after 5pm and on weekends call [phone] 08/05/2015, 7:51 AM   Patient examined chart reviewed. For cath today SEMI.  Would need BMS if intervention done both in terms of previous anticoagulation and expediting wu of lung cancer.  Will see pulmonary as outpatient and have PET scan as initial w/u Daughter will be driving in from [place] with husband Has good right radial pulse  Jenkins Rouge

## 2015-08-05 NOTE — Interval H&P Note (Signed)
Cath Lab Visit (complete for each Cath Lab visit)  Clinical Evaluation Leading to the Procedure:   ACS: Yes.    Non-ACS:    Anginal Classification: CCS IV  Anti-ischemic medical therapy: No Therapy  Non-Invasive Test Results: High-risk stress test findings: cardiac mortality >3%/year  Prior CABG: No previous CABG  Abnormal echocardiogram.    History and Physical Interval Note:  08/05/2015 1:32 PM  Heather Howard  has presented today for surgery, with the diagnosis of cp  The various methods of treatment have been discussed with the patient and family. After consideration of risks, benefits and other options for treatment, the patient has consented to  Procedure(s): Left Heart Cath and Coronary Angiography (N/A) as a surgical intervention .  The patient's history has been reviewed, patient examined, no change in status, stable for surgery.  I have reviewed the patient's chart and labs.  Questions were answered to the patient's satisfaction.     [name],[name]

## 2015-08-05 NOTE — H&P (View-Only) (Signed)
Pt. Profile:  75 year-old female with history of permanent atrial fibrillation on Eliquis, hypertension, hyperlipidemia, COPD and GERD presenting as a transfer from [facility] for non-ST elevation myocardial infarction.   Subjective: No chest pain, no SOB + coughing.  A fib rate controlled.  Objective: Vital signs in last 24 hours: Temp:  [97.5 F (36.4 C)-99.2 F (37.3 C)] 97.5 F (36.4 C) (02/17 0400) Pulse Rate:  [56-92] 72 (02/17 0600) Resp:  [16-23] 22 (02/17 0600) BP: (82-124)/(39-67) 114/54 mmHg (02/17 0600) SpO2:  [92 %-100 %] 97 % (02/17 0600) Weight:  [189 lb 3.2 oz (85.821 kg)] 189 lb 3.2 oz (85.821 kg) (02/17 0400) Weight change: -1 lb 11.5 oz (-0.779 kg) Last BM Date: 08/04/15 Intake/Output from previous day: -918 02/16 0701 - 02/17 0700 In: 1251.8 [P.O.:680; I.V.:571.8] Out: 1900 [Urine:1900] Intake/Output this shift:    PE: General:Pleasant affect, NAD Skin:Warm and dry, brisk capillary refill HEENT:normocephalic, sclera clear, mucus membranes moist Neck:supple, no JVD, no bruits  Heart:irreg irreg without murmur, gallup, rub or click Lungs:diminished without rales, rhonchi, or wheezes- just did nebulizer [id number], non tender, + BS, do not palpate liver spleen or masses Ext:no lower ext edema, 2+ pedal pulses, 2+ radial pulses Neuro:alert and oriented X 3, MAE, follows commands, + facial symmetry Tele: a fib rate controlled   Lab Results:  Recent Labs  08/04/15 0420 08/05/15 0325  WBC 8.2 7.1  HGB 10.0* 9.5*  HCT 31.4* 30.1*  PLT 352 330   BMET  Recent Labs  08/04/15 0805 08/05/15 0325  NA 129* 133*  K 3.4* 3.4*  CL 95* 98*  CO2 24 25  GLUCOSE 113* 112*  BUN 9 9  CREATININE 0.73 0.74  CALCIUM 8.7* 8.5*    Recent Labs  08/04/15 0145 08/04/15 0805  TROPONINI 1.05* 0.94*    Lab Results  Component Value Date   CHOL 136 08/04/2015   HDL 51 08/04/2015   LDLCALC 78 08/04/2015   TRIG 35 08/04/2015   CHOLHDL 2.7  08/04/2015   Lab Results  Component Value Date   HGBA1C 5.9* 08/03/2015     Lab Results  Component Value Date   TSH 1.674 09/21/2014    Hepatic Function Panel  Recent Labs  08/03/15 2030  PROT 6.1*  ALBUMIN 2.9*  AST 32  ALT 18  ALKPHOS 103  BILITOT 0.5    Recent Labs  08/04/15 0420  CHOL 136   No results for input(s): PROTIME in the last 72 hours.     Studies/Results: ECHO: Study Conclusions  - Left ventricle: The cavity size was normal. Systolic function was moderately reduced. The estimated ejection fraction was in the range of 35% to 40%. There is akinesis of the mid inferoseptal, apical septal and apical myocardium. There is akinesis of the mid anteroseptal and apical anteriorl myocardium. . There is akinesis of the apicallateral myocardium. There is akinesis of the mid-apicalinferolateral and inferior myocardium. - Aortic valve: There was mild regurgitation. - Mitral valve: There was mild regurgitation. - Left atrium: The atrium was mildly dilated. - Right ventricle: There is akinesis of the mid and apical portions of the RV. - Pulmonary arteries: PA peak pressure: 40 mm Hg (S).  Impressions:  - The right ventricular systolic pressure was increased consistent with moderate pulmonary hypertension.  Medications: I have reviewed the patient's current medications. Scheduled Meds: . albuterol  2.5 mg Inhalation QID  . aspirin EC  81 mg Oral Daily  . atorvastatin  80  mg Oral q1800  . budesonide  0.25 mg Nebulization BID  . montelukast  10 mg Oral Daily  . pantoprazole  40 mg Oral Daily  . zinc oxide   Topical BID   Continuous Infusions: . sodium chloride 1 mL/kg/hr (08/05/15 0700)  . heparin 1,000 Units/hr (08/04/15 2000)  . nitroGLYCERIN 10 mcg/min (08/04/15 2000)   PRN Meds:.sodium chloride, acetaminophen, benzonatate, guaiFENesin-codeine, ondansetron (ZOFRAN) IV  Assessment/Plan: Principal Problem:   NSTEMI (non-ST elevated  myocardial infarction) (HCC)--symptoms and enzyme trend is consistent with non-STEMI. She is currently CP free on IV nitro. She has been on Eliquis for a/c given permanent atrial fibrillation. Her last dose was night of 08/02/15. Treat with IV heparin per pharmacy until cath. Troponin 1.41-->1.05.  --- for cardiac cath today NPO after clear liquid, she was able to lie flat though still with cough at times.  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    Essential hypertension--currently more hypotensive on low dose NTG   COPD (chronic obstructive pulmonary disease) (HCC)   Lung nodule Rt middle lobe- Pulmonary has seen - plan for outpt eval with Bronch and PET scan.  Would need to be off ASA and Plavix for procedure.     Anemia  9.5/30 on admit 10.1/31/5    Hypokalemia will replace.   LOS: 2 days   Time spent with pt. :15 minutes. [facility] R  Nurse Practitioner Certified Pager [phone] or after 5pm and on weekends call [phone] 08/05/2015, 7:51 AM   Patient examined chart reviewed. For cath today SEMI.  Would need BMS if intervention done both in terms of previous anticoagulation and expediting wu of lung cancer.  Will see pulmonary as outpatient and have PET scan as initial w/u Daughter will be driving in from [place] with husband Has good right radial pulse  Jenkins Rouge

## 2015-08-05 NOTE — Care Management Note (Signed)
Case Management Note  Patient Details  Name: CHEDVA ALLEYNE MRN: [id number] Date of Birth: [birth year]  Subjective/Objective:       Adm w mi             Action/Plan:live w husband  Expected Discharge Date:                  Expected Discharge Plan:     In-House Referral:     Discharge planning Services  CM Consult, Medication Assistance  Post Acute Care Choice:    Choice offered to:     DME Arranged:    DME Agency:     HH Arranged:    [place] Agency:     Status of Service:     Medicare Important Message Given:    Date Medicare IM Given:    Medicare IM give by:    Date Additional Medicare IM Given:    Additional Medicare Important Message give by:     If discussed at [place] of Stay Meetings, dates discussed:    Additional Comments:back from cath lab. Maybe on bilinta x30days. 30day free brilinta card given.  Lacretia Leigh, RN 08/05/2015, 3:40 PM

## 2015-08-05 NOTE — Progress Notes (Signed)
[place] for Heparin Indication: NSTEMI  Allergies  Allergen Reactions  . Carbocaine [Mepivacaine Hcl] Swelling  . Penicillins Swelling    Patient Measurements: Height: 5\' 2"  (157.5 cm) Weight: 189 lb 3.2 oz (85.821 kg) IBW/kg (Calculated) : 50.1 Heparin Dosing Weight: 69.5kg  Vital Signs: Temp: 98.7 F (37.1 C) (02/17 1700) Temp Source: Oral (02/17 1700) BP: 123/79 mmHg (02/17 1700) Pulse Rate: 72 (02/17 1700)  Labs:  Recent Labs  08/03/15 2030 08/04/15 0145 08/04/15 0420 08/04/15 0805 08/04/15 1045 08/05/15 0325  HGB 10.1*  --  10.0*  --   --  9.5*  HCT 31.5*  --  31.4*  --   --  30.1*  PLT 357  --  352  --   --  330  APTT  --   --  73*  --  79* 74*  LABPROT  --   --  17.4*  --   --   --   INR  --   --  1.42  --   --   --   HEPARINUNFRC  --   --  1.08*  --   --  0.64  CREATININE 0.81  --   --  0.73  --  0.74  TROPONINI 1.41* 1.05*  --  0.94*  --   --     Estimated Creatinine Clearance: 61.8 mL/min (by C-G formula based on Cr of 0.74).   Medications:  Heparin @ 1000 units/hr  Assessment: 74yof on eliquis pta, transitioned to IV heparin for NSTEMI. Heparin level and aPTT are therapeutic and correlating. CBC stable. No bleeding reported. Pt had cath today which showed mid RCA 70% stenosed. Pt will continue full dose heparin until she is transitioned back to Eliquis.  Sheath is still in, deflating slowly in this patient, estimated that heparin should restart around 0800 tomorrow morning, based on when sheath is expected to be pulled.   Goal of Therapy:  APTT 66-102 seconds Heparin level 0.3-0.7 units/ml Monitor platelets by anticoagulation protocol: Yes   Plan:  -Start heparin 8 hours s/p sheath at 1000 units/hr -Daily HL, CBC, aptt -F/u transition back to [employer] 08/05/2015,5:32 PM

## 2015-08-06 DIAGNOSIS — I10 Essential (primary) hypertension: Secondary | ICD-10-CM

## 2015-08-06 DIAGNOSIS — I482 Chronic atrial fibrillation: Secondary | ICD-10-CM

## 2015-08-06 DIAGNOSIS — J438 Other emphysema: Secondary | ICD-10-CM

## 2015-08-06 DIAGNOSIS — I255 Ischemic cardiomyopathy: Secondary | ICD-10-CM

## 2015-08-06 DIAGNOSIS — R911 Solitary pulmonary nodule: Secondary | ICD-10-CM

## 2015-08-06 LAB — BASIC METABOLIC PANEL
Anion gap: 7 (ref 5–15)
BUN: 7 mg/dL (ref 6–20)
CHLORIDE: 102 mmol/L (ref 101–111)
CO2: 22 mmol/L (ref 22–32)
CREATININE: 0.69 mg/dL (ref 0.44–1.00)
Calcium: 8.5 mg/dL — ABNORMAL LOW (ref 8.9–10.3)
GFR calc Af Amer: >60 mL/min (ref >60)
GFR calc non Af Amer: >60 mL/min (ref >60)
GLUCOSE: 109 mg/dL — AB (ref 65–99)
POTASSIUM: 4.1 mmol/L (ref 3.5–5.1)
SODIUM: 131 mmol/L — AB (ref 135–145)

## 2015-08-06 LAB — CBC
HCT: 31.5 % — ABNORMAL LOW (ref 36.0–46.0)
HEMOGLOBIN: 10 g/dL — AB (ref 12.0–15.0)
MCH: 29.6 pg (ref 26.0–34.0)
MCHC: 31.7 g/dL (ref 30.0–36.0)
MCV: 93.2 fL (ref 78.0–100.0)
PLATELETS: 335 10*3/uL (ref 150–400)
RBC: 3.38 MIL/uL — AB (ref 3.87–5.11)
RDW: 15.2 % (ref 11.5–15.5)
WBC: 7.5 10*3/uL (ref 4.0–10.5)

## 2015-08-06 LAB — HEPARIN LEVEL (UNFRACTIONATED): Heparin Unfractionated: 0.5 IU/mL (ref 0.30–0.70)

## 2015-08-06 MED ORDER — DIPHENHYDRAMINE HCL 25 MG PO CAPS
25.0000 mg | ORAL_CAPSULE | Freq: Four times a day (QID) | ORAL | Status: DC | PRN
  Administered 2015-08-06 – 2015-08-07 (×2): 25 mg via ORAL
  Filled 2015-08-06 (×2): qty 1

## 2015-08-06 MED ORDER — METOPROLOL TARTRATE 25 MG PO TABS
25.0000 mg | ORAL_TABLET | Freq: Two times a day (BID) | ORAL | Status: DC
  Administered 2015-08-06 – 2015-08-08 (×5): 25 mg via ORAL
  Filled 2015-08-06 (×5): qty 1

## 2015-08-06 MED ORDER — CLOPIDOGREL BISULFATE 75 MG PO TABS
75.0000 mg | ORAL_TABLET | Freq: Every day | ORAL | Status: DC
  Administered 2015-08-07 – 2015-08-08 (×2): 75 mg via ORAL
  Filled 2015-08-06 (×2): qty 1

## 2015-08-06 MED ORDER — CLOPIDOGREL BISULFATE 300 MG PO TABS
300.0000 mg | ORAL_TABLET | Freq: Once | ORAL | Status: AC
  Administered 2015-08-06: 300 mg via ORAL
  Filled 2015-08-06: qty 1

## 2015-08-06 NOTE — Progress Notes (Signed)
CARDIAC REHAB PHASE I   PRE:  Rate/Rhythm: 74 afib  BP:  Supine: 123/66   SaO2: 98% RA  MODE:  Ambulation: 200 ft   POST:  Rate/Rhythem: 69 afib  BP:  Supine: 111/63   SaO2: 97% RA  Pt ambulated 200 ft with assist x2 using rolling walker.  Pt tolerated walk, but fatigued some and did not want to push too far.  Completed pt d/c education with pt and daughter.  Discussed risk factors, restrictions, MI book, exercise guidelines, diet, stent, NTG use, and when to call 911/MD.  Also reviewed CR PhII with request sent to [facility].  Pt and daughter voiced understanding. Alberteen Sam, MA, ACSM RCEP [phone]  Clotilde Dieter

## 2015-08-06 NOTE — Progress Notes (Addendum)
SUBJECTIVE: The patient is doing well today.  Chest pain is resolved.  Feels "anxious".  SOB is at baseline   . albuterol  2.5 mg Inhalation QID  . aspirin  81 mg Oral Daily  . atorvastatin  80 mg Oral q1800  . budesonide  0.25 mg Nebulization BID  . metoprolol tartrate  25 mg Oral BID  . montelukast  10 mg Oral Daily  . pantoprazole  40 mg Oral Daily  . sodium chloride flush  3 mL Intravenous Q12H  . ticagrelor  90 mg Oral BID  . zinc oxide   Topical BID   . heparin      OBJECTIVE: Physical Exam: Filed Vitals:   08/06/15 0400 08/06/15 0500 08/06/15 0600 08/06/15 0700  BP: 103/82 122/67 138/59 130/101  Pulse: 82 70 70 86  Temp: 97.8 F (36.6 C)     TempSrc: Oral     Resp: 21 17 17 19   Height:      Weight:  193 lb 3.2 oz (87.635 kg)    SpO2: 100% 99% 99% 99%    Intake/Output Summary (Last 24 hours) at 08/06/15 0720 Last data filed at 08/06/15 0500  Gross per 24 hour  Intake 1503.46 ml  Output   1250 ml  Net 253.46 ml    Telemetry reveals sinus rhythm  GEN- The patient is well appearing, alert and oriented x 3 today.   Head- normocephalic, atraumatic Eyes-  Sclera clear, conjunctiva pink Ears- hearing intact Oropharynx- clear Neck- supple,  Lungs- Clear to ausculation bilaterally, normal work of breathing Heart- Regular rate and rhythm  GI- soft, NT, ND, + BS Extremities- no clubbing, cyanosis, or edema, arterial cath site with very small hematoma, good pulses and cap refill,  Strength/sensation intact Skin- no rash or lesion Psych- euthymic mood, full affect Neuro- strength and sensation are intact  LABS: Basic Metabolic Panel:  Recent Labs  08/05/15 0325 08/06/15 0310  NA 133* 131*  K 3.4* 4.1  CL 98* 102  CO2 25 22  GLUCOSE 112* 109*  BUN 9 7  CREATININE 0.74 0.69  CALCIUM 8.5* 8.5*   Liver Function Tests:  Recent Labs  08/03/15 2030  AST 32  ALT 18  ALKPHOS 103  BILITOT 0.5  PROT 6.1*  ALBUMIN 2.9*   No results for input(s):  LIPASE, AMYLASE in the last 72 hours. CBC:  Recent Labs  08/03/15 2030  08/05/15 0325 08/06/15 0310  WBC 9.7  < > 7.1 7.5  NEUTROABS 7.6  --   --   --   HGB 10.1*  < > 9.5* 10.0*  HCT 31.5*  < > 30.1* 31.5*  MCV 91.6  < > 92.3 93.2  PLT 357  < > 330 335  < > = values in this interval not displayed. Cardiac Enzymes:  Recent Labs  08/03/15 2030 08/04/15 0145 08/04/15 0805  TROPONINI 1.41* 1.05* 0.94*   BNP: Invalid input(s): POCBNP D-Dimer: No results for input(s): DDIMER in the last 72 hours. Hemoglobin A1C:  Recent Labs  08/03/15 2030  HGBA1C 5.9*   Fasting Lipid Panel:  Recent Labs  08/04/15 0420  CHOL 136  HDL 51  LDLCALC 78  TRIG 35  CHOLHDL 2.7   ASSESSMENT AND PLAN:  1.  NSTEMI (non-ST elevated myocardial infarction) S/p PVI mid LAD by Dr Irish Lack Recovering Stop nitro drip On asa and brilinta On atorvastatin Will add beta blocker  2. Permanent atrial fibrillation Rate controlled Add metoprolol s/p NSTEMI Anticoagulation will likely be  an issue On heparin drip currently Would not advise NOAC with brilinta and thus would require coumadin.  An alternative would be to switch brilinta to plavix and then resume eliquis  3. Ischemic CM (EF 35-40%)  Add beta blocker today Add ace inhibitor tomorrow if able  4. HTN Stable No change required today  5. COPD/ Lung nodule plan for outpt eval with Bronch and PET scan. Would need to be off ASA and Plavix for procedure. BMS used for PCI.  Transfer to telemetry   Thompson Grayer, MD 08/06/2015 7:20 AM   I spoke with Dr Irish Lack this am.  He recommends stopping brilinta, loading with plavix 300mg  po x 1 and then starting plavix 75mg  daily. Once stable on this regimen, will restart eliquis (probably tomorrow). I agree with this plan.  I appreciate Dr Hassell Done assistance with this complex anticoagulation issue.  Thompson Grayer MD, [facility] 08/06/2015 8:56 AM

## 2015-08-06 NOTE — Progress Notes (Signed)
Heather Howard for Heparin Indication: NSTEMI  Allergies  Allergen Reactions  . Carbocaine [Mepivacaine Hcl] Swelling  . Penicillins Swelling    Patient Measurements: Height: 5\' 2"  (157.5 cm) Weight: 193 lb 3.2 oz (87.635 kg) IBW/kg (Calculated) : 50.1 Heparin Dosing Weight: 69.5kg  Vital Signs: Temp: 98.2 F (36.8 C) (02/18 1539) Temp Source: Oral (02/18 1539) BP: 92/56 mmHg (02/18 1539) Pulse Rate: 63 (02/18 1539)  Labs:  Recent Labs  08/03/15 2030 08/04/15 0145 08/04/15 0420 08/04/15 0805 08/04/15 1045 08/05/15 0325 08/06/15 0310 08/06/15 1608  HGB 10.1*  --  10.0*  --   --  9.5* 10.0*  --   HCT 31.5*  --  31.4*  --   --  30.1* 31.5*  --   PLT 357  --  352  --   --  330 335  --   APTT  --   --  73*  --  79* 74*  --   --   LABPROT  --   --  17.4*  --   --   --   --   --   INR  --   --  1.42  --   --   --   --   --   HEPARINUNFRC  --   --  1.08*  --   --  0.64  --  0.50  CREATININE 0.81  --   --  0.73  --  0.74 0.69  --   TROPONINI 1.41* 1.05*  --  0.94*  --   --   --   --     Estimated Creatinine Clearance: 62.4 mL/min (by C-G formula based on Cr of 0.69).   Medications:  Heparin @ 1000 units/hr  Assessment: Heather Howard on eliquis pta, transitioned to IV heparin for NSTEMI. Heparin level and aPTT are therapeutic and correlating. CBC stable. No bleeding reported. Pt had cath today which showed mid RCA 70% stenosed. Pt will continue full dose heparin until she is transitioned back to Eliquis. -Initial heparin level post cath is 0.5 -Noted at 1000 units/hr pre-cath with HL= 0.64  Goal of Therapy:  APTT 66-102 seconds Heparin level 0.3-0.7 units/ml Monitor platelets by anticoagulation protocol: Yes   Plan:  -Continue heparin at 1000 units/hr -Daily heparin level and CBC  Hildred Laser, Pharm D 08/06/2015 5:11 PM

## 2015-08-06 NOTE — Progress Notes (Signed)
TR band removed at 01:00, gauze and tegaderm dressing applied at the time of removal. Site is a level 1 with no bleeding and a small bruise distal to the insertion site.  Will continue to monitor.  Paulene Floor, RN 08/06/2015 2:02 AM

## 2015-08-07 DIAGNOSIS — I481 Persistent atrial fibrillation: Secondary | ICD-10-CM

## 2015-08-07 LAB — BASIC METABOLIC PANEL
ANION GAP: 7 (ref 5–15)
BUN: 10 mg/dL (ref 6–20)
CALCIUM: 8.5 mg/dL — AB (ref 8.9–10.3)
CHLORIDE: 100 mmol/L — AB (ref 101–111)
CO2: 23 mmol/L (ref 22–32)
CREATININE: 0.75 mg/dL (ref 0.44–1.00)
GFR calc Af Amer: >60 mL/min (ref >60)
GFR calc non Af Amer: >60 mL/min (ref >60)
GLUCOSE: 117 mg/dL — AB (ref 65–99)
Potassium: 4.1 mmol/L (ref 3.5–5.1)
Sodium: 130 mmol/L — ABNORMAL LOW (ref 135–145)

## 2015-08-07 LAB — CBC
HCT: 30.5 % — ABNORMAL LOW (ref 36.0–46.0)
HEMOGLOBIN: 9.5 g/dL — AB (ref 12.0–15.0)
MCH: 29.1 pg (ref 26.0–34.0)
MCHC: 31.1 g/dL (ref 30.0–36.0)
MCV: 93.6 fL (ref 78.0–100.0)
Platelets: 328 10*3/uL (ref 150–400)
RBC: 3.26 MIL/uL — ABNORMAL LOW (ref 3.87–5.11)
RDW: 15.4 % (ref 11.5–15.5)
WBC: 7.8 10*3/uL (ref 4.0–10.5)

## 2015-08-07 LAB — HEPARIN LEVEL (UNFRACTIONATED): Heparin Unfractionated: 0.36 IU/mL (ref 0.30–0.70)

## 2015-08-07 MED ORDER — ZOLPIDEM TARTRATE 5 MG PO TABS
5.0000 mg | ORAL_TABLET | Freq: Every evening | ORAL | Status: DC | PRN
Start: 2015-08-07 — End: 2015-08-08

## 2015-08-07 MED ORDER — APIXABAN 5 MG PO TABS
5.0000 mg | ORAL_TABLET | Freq: Two times a day (BID) | ORAL | Status: DC
  Administered 2015-08-07 – 2015-08-08 (×3): 5 mg via ORAL
  Filled 2015-08-07 (×3): qty 1

## 2015-08-07 MED ORDER — LISINOPRIL 2.5 MG PO TABS
2.5000 mg | ORAL_TABLET | Freq: Every day | ORAL | Status: DC
  Administered 2015-08-07 – 2015-08-08 (×2): 2.5 mg via ORAL
  Filled 2015-08-07 (×2): qty 1

## 2015-08-07 NOTE — Progress Notes (Signed)
Utilization review completed.  

## 2015-08-07 NOTE — Progress Notes (Signed)
SUBJECTIVE:  No chest pain.  No SOB.     PHYSICAL EXAM Filed Vitals:   08/06/15 1400 08/06/15 1539 08/06/15 2024 08/07/15 0358  BP: 111/63 92/56 109/58 134/75  Pulse:  63 75 76  Temp:  98.2 F (36.8 C) 98.1 F (36.7 C) 98.3 F (36.8 C)  TempSrc:  Oral Oral Oral  Resp: 17 20 18 20   Height:      Weight:      SpO2:  99% 100% 100%   General:  No distress Lungs:  Clear Heart:  RRR Abdomen:  Positive bowel sounds, no rebound no guarding Extremities:  No edema  LABS:  Results for orders placed or performed during the hospital encounter of 08/03/15 (from the past 24 hour(s))  Heparin level (unfractionated)     Status: None   Collection Time: 08/06/15  4:08 PM  Result Value Ref Range   Heparin Unfractionated 0.50 0.30 - 0.70 IU/mL  Heparin level (unfractionated)     Status: None   Collection Time: 08/07/15  2:19 AM  Result Value Ref Range   Heparin Unfractionated 0.36 0.30 - 0.70 IU/mL  Basic metabolic panel     Status: Abnormal   Collection Time: 08/07/15  2:19 AM  Result Value Ref Range   Sodium 130 (L) 135 - 145 mmol/L   Potassium 4.1 3.5 - 5.1 mmol/L   Chloride 100 (L) 101 - 111 mmol/L   CO2 23 22 - 32 mmol/L   Glucose, Bld 117 (H) 65 - 99 mg/dL   BUN 10 6 - 20 mg/dL   Creatinine, Ser 0.75 0.44 - 1.00 mg/dL   Calcium 8.5 (L) 8.9 - 10.3 mg/dL   GFR calc non Af Amer >60 >60 mL/min   GFR calc Af Amer >60 >60 mL/min   Anion gap 7 5 - 15  CBC     Status: Abnormal   Collection Time: 08/07/15  2:19 AM  Result Value Ref Range   WBC 7.8 4.0 - 10.5 K/uL   RBC 3.26 (L) 3.87 - 5.11 MIL/uL   Hemoglobin 9.5 (L) 12.0 - 15.0 g/dL   HCT 30.5 (L) 36.0 - 46.0 %   MCV 93.6 78.0 - 100.0 fL   MCH 29.1 26.0 - 34.0 pg   MCHC 31.1 30.0 - 36.0 g/dL   RDW 15.4 11.5 - 15.5 %   Platelets 328 150 - 400 K/uL    Intake/Output Summary (Last 24 hours) at 08/07/15 0758 Last data filed at 08/07/15 0104  Gross per 24 hour  Intake   2006 ml  Output    850 ml  Net   1156 ml       ASSESSMENT AND PLAN:  NSTEMI:  She will need DAPT and warfarin.    ATRIAL FIB:  Beta blocker added yesterday.  Rate controlled.   Will start Eliquis.   [name] has a CHA2DS2 - VASc score of 4 with a risk of stroke of 4%.  ISCHEMIC CARDIOMYOPATHY:  EF is now 25%.  This is new.  I am going to try to start a low dose of ACE inhibitor but her BP is labile.  Med titration will be slow for now.   HTN:  This is being managed in the context of treating his CHF  COPD/LUNG NODULE:    BMS placed.  Will have bronch when off ASA/Plavix.  She will be on DAPT x 1 month.   She has been seen by pulmonary and at discharge we will need  to make sure that follow up is scheduled.     HYPONATREMIA:  Follow as an outpatient.    ANEMIA:  No evidence of active bleeding.  Send home with stool quaiac cards.     Heather Howard 08/07/2015 7:58 AM

## 2015-08-08 ENCOUNTER — Telehealth: Payer: Self-pay | Admitting: Physician Assistant

## 2015-08-08 ENCOUNTER — Other Ambulatory Visit: Payer: Self-pay | Admitting: Cardiology

## 2015-08-08 ENCOUNTER — Telehealth: Payer: Self-pay | Admitting: Cardiology

## 2015-08-08 ENCOUNTER — Other Ambulatory Visit: Payer: Self-pay | Admitting: Physician Assistant

## 2015-08-08 DIAGNOSIS — D6489 Other specified anemias: Secondary | ICD-10-CM

## 2015-08-08 MED ORDER — ASPIRIN 81 MG PO CHEW
81.0000 mg | CHEWABLE_TABLET | Freq: Every day | ORAL | Status: DC
Start: 2015-08-08 — End: 2015-08-15

## 2015-08-08 MED ORDER — LISINOPRIL 2.5 MG PO TABS: 2.5000 mg | ORAL_TABLET | Freq: Every day | ORAL | Status: DC

## 2015-08-08 MED ORDER — ATORVASTATIN CALCIUM 80 MG PO TABS: 80.0000 mg | ORAL_TABLET | Freq: Every day | ORAL | Status: DC

## 2015-08-08 MED ORDER — ALBUTEROL SULFATE (2.5 MG/3ML) 0.083% IN NEBU: 2.5000 mg | INHALATION_SOLUTION | RESPIRATORY_TRACT | Status: DC | PRN

## 2015-08-08 MED ORDER — APIXABAN 5 MG PO TABS: 5.0000 mg | ORAL_TABLET | Freq: Two times a day (BID) | ORAL | Status: AC

## 2015-08-08 MED ORDER — APIXABAN 5 MG PO TABS: 5.0000 mg | ORAL_TABLET | Freq: Two times a day (BID) | ORAL | Status: DC

## 2015-08-08 MED ORDER — FUROSEMIDE 20 MG PO TABS: 20.0000 mg | ORAL_TABLET | ORAL | Status: DC | PRN

## 2015-08-08 MED ORDER — NITROGLYCERIN 0.4 MG SL SUBL: 0.4000 mg | SUBLINGUAL_TABLET | SUBLINGUAL | Status: DC | PRN

## 2015-08-08 MED ORDER — METOPROLOL TARTRATE 25 MG PO TABS: 25.0000 mg | ORAL_TABLET | Freq: Two times a day (BID) | ORAL | Status: DC

## 2015-08-08 MED ORDER — CLOPIDOGREL BISULFATE 75 MG PO TABS: 75.0000 mg | ORAL_TABLET | Freq: Every day | ORAL | Status: DC

## 2015-08-08 NOTE — Care Management Note (Signed)
Case Management Note Marvetta Gibbons RN, BSN Unit 2W-Case Manager [phone]  Patient Details  Name: COLLIE LAPIANA MRN: [id number] Date of Birth: Nov 13, 1940  Subjective/Objective:    Pt admitted with NSTEMI, s/p cath                 Action/Plan: PTA Pt lived at home with spouse- ambulated well with Cardiac rehab 200 feet +, per conversation with pt at bedside- she has RW, rollator, and cane at home- has used [facility] in past, agency of choice is [facility] if [facility] is needed. Pt will not be going home on Brilinta- instead has been restarted on Eliquis which pt was on pre-admit- and plavix/asa. As pt is [facility] does not qualify for copay assist card or any other medication assistance with Medicare. Pt reports that she has never used 30 day free card for Eliquis- so 30 day free card was given to pt for use with Eliquis prescription.  No further CM needs noted  Expected Discharge Date:     08/08/15             Expected Discharge Plan:  Home/Self Care  In-House Referral:     Discharge planning Services  CM Consult, Medication Assistance  Post Acute Care Choice:  NA Choice offered to:  NA  DME Arranged:   rolling walker DME Agency:   [facility]  HH Arranged:    [place] Agency:     Status of Service:  Completed, signed off  Medicare Important Message Given:    Date Medicare IM Given:    Medicare IM give by:    Date Additional Medicare IM Given:    Additional Medicare Important Message give by:     If discussed at [place] of Stay Meetings, dates discussed:    Discharge Disposition: Home/self care   Additional Comments:  08/08/15- Lindsay RN, BSN- per PT and bedside RN - pt actually does not have RW at home and needs one- order placed- and call made to [facility] with [facility] - for DME need- RW to be delivered to room prior to discharge.   Dawayne Patricia, RN 08/08/2015, 11:16 AM

## 2015-08-08 NOTE — Progress Notes (Signed)
Patient given discharge instructions given with medication list and paper prescriptions,  With follow up appointments. All questions answered will discharge home as ordered. Hyla Coard, Bettina Gavia RN

## 2015-08-08 NOTE — Progress Notes (Signed)
Infection prevention called regarding patient, stated OK to take off droplet precautions, this was done. Lorrine Killilea, Bettina Gavia RN

## 2015-08-08 NOTE — Discharge Instructions (Signed)

## 2015-08-08 NOTE — Telephone Encounter (Signed)
Rx request sent to pharmacy.  

## 2015-08-08 NOTE — Progress Notes (Signed)
CARDIAC REHAB PHASE I   PRE:  Rate/Rhythm: 68 afib    BP: sitting 110/69    SaO2: 98 RA  MODE:  Ambulation: 260 ft   POST:  Rate/Rhythm: 89 afib    BP: sitting 126/64     SaO2: 99 RA  Pt ambulated with RW, supervision assist. No c/o, fatiguing toward end. Pt did not remember any of her education from Saturday therefore discussed MI, stent, restrictions, meds, HF, daily wts, low sodium, NTG and CRPII with pt, son, and husband. Voiced understanding. Gave HF booklet and set up HF video. Pt also to watch afib and MI videos today.  [place], ACSM 08/08/2015 11:28 AM

## 2015-08-08 NOTE — Telephone Encounter (Signed)
He needs clarification on a prescription written today for Lasix.

## 2015-08-08 NOTE — Telephone Encounter (Signed)
Spoke to [place] with pharmacy - needs clarification on frequency of prn lasix - clarified with B. Rosita Fire, [place] who wrote discharge summary - QD prn - clarified with Aaron Edelman.  Aaron Edelman also wanted to make sure providers were aware that patient is on Eliquis and was prescribed clopidogrel as well. Informed him that cardiology prescribed meds and is aware.

## 2015-08-08 NOTE — Discharge Summary (Signed)
Discharge Summary    Patient ID: Heather Howard,  MRN: [id number], DOB/AGE: 76/76/1942 76 y.o.  Admit date: 08/03/2015 Discharge date: 08/08/2015  Primary Care Provider: PERRY,LAWRENCE EDWARD Primary Cardiologist: Dr. Stanford Breed  Discharge Diagnoses    Principal Problem:   NSTEMI (non-ST elevated myocardial infarction) [facility]) Active Problems:   Atrial fibrillation ([place])   Essential hypertension   COPD (chronic obstructive pulmonary disease) (HCC)   Lung nodule-Rt middle lobe   Hypokalemia   Anemia   Allergies Allergies  Allergen Reactions  . Carbocaine [Mepivacaine Hcl] Swelling  . Penicillins Swelling    Diagnostic Studies/Procedures    LHC 08/05/15  Procedures    Coronary Stent Intervention   Intravascular Pressure Wire/FFR Study   Left Heart Cath and Coronary Angiography    Conclusion     There is severe left ventricular systolic dysfunction.  Mid LAD lesion, 70% stenosed, calcified irregular lesion significant by FFR (0.73). Post intervention with a 3.5 x 15 vision bare-metal stent, there is a 0% residual stenosis.     2D Echo 08/04/15 Study Conclusions  - Left ventricle: The cavity size was normal. Systolic function was moderately reduced. The estimated ejection fraction was in the range of 35% to 40%. There is akinesis of the mid inferoseptal, apical septal and apical myocardium. There is akinesis of the mid anteroseptal and apical anteriorl myocardium. . There is akinesis of the apicallateral myocardium. There is akinesis of the mid-apicalinferolateral and inferior myocardium. - Aortic valve: There was mild regurgitation. - Mitral valve: There was mild regurgitation. - Left atrium: The atrium was mildly dilated. - Right ventricle: There is akinesis of the mid and apical portions of the RV. - Pulmonary arteries: PA peak pressure: 40 mm Hg (S).  Impressions:  - The right ventricular systolic pressure was increased  consistent with moderate pulmonary hypertension.    History of Present Illness     75 year-old female, followed by Dr. Stanford Breed, with history of permanent atrial fibrillation on Eliquis, hypertension, hyperlipidemia, COPD, prior history of tobacco use and GERD. No prior history of CAD. She initially presented to [facility] on 08/03/15 with a complaint of URI symptoms (cough + sore throat), along with chest, neck and jaw pain. At [facility], her EKG showed rate controlled atrial fibrillation with a new RBBB. Initial troponin was abnormal at 0.57 and second troponin rose to 1.2. D-dimer was also elevated. Subsequent Chest CT was negative for PE but showed a 1 cm speculated mass in the right middle lobe and enlarged right para tracheal lymph nodes suspicious for malignancy. She was transferred to [facility] for management of NSTEMI and further w/u regarding her abnormal chest CT.    Hospital Course     Patient arrived to [facility] night of 08/03/15. She was CP free on arrival with IV nitro. Given her upper respiratory symptoms and slightly elevated WBC, she was placed on droplet precaution protocol. Viral panel was obtained and negative for influenza. She tested positive for rhinovirus. CXR was negative for PNA. Given that she was on Eliquis, her cath was delayed until 08/05/15, to allow for Eliquis washout. She was started on IV heparin per pharmacy. Cardiac enzymes were cycled and peaked at 1.41. 2D echo showed moderately reduced EF of 35-40%. On 08/05/15, she underwent LHC per Dr. Irish Lack. She was found to have a mid LAD lesion, 70% stenosed, calcified irregular lesion significant by FFR (0.73). She underwent successful, PCI + BMS to the mid LAD. BMS was chosen given need for lung  nodule biopsy in the near future. She tolerated the procedure well and left the cath lab in stable condition. She had no recurrent CP. She had no post cath complications. She was placed on DAPT with ASA + Plavix. Eliquis was also  continued given her atrial fibrillation and CHA2DS2 VASc score of 5 (Embolic risk factors of age 29, hypertension, female sex and borderline diabetes mellitus). She was also continued on a BB and ACE-I given her LV dysfunction. High intensity statin was also prescribed given a LDL level of 78 mg/dL.   Pulmonology was consulted given her incidental chest CT findings. It was recommended that she undergo a bronchoscopy and nodule biopsy, after she has completed 1 month of DAPT given her new coronary stent. She will continue ASA and Plavix for 30 days, at which point can be held temporarily for her procedure. She is scheduled for an outpatient f/u with Dr. Lamonte Sakai on 08/30/15.   Prior to discharge, the patient was assessed by PT who felt that she was stable for discharge home w/o need for [facility] services. They recommended that she be discharged home with a rolling walker to assist with ambulation. This was arranged.  She was last seen and examined by Dr. Debara Pickett who determined she was stable for discharge home.   In addition to the meds outlined above, she was also prescribed 20 mg of Lasix, PRN, to be used if she develops any weight gain, edema or dyspnea. She will be seen in clinic for 7 day TOC f/u with Tarri Fuller, PA-C, on 08/15/15.     Consultants: Pulmonology    Discharge Vitals Blood pressure 102/60, pulse 63, temperature 98 F (36.7 C), temperature source Oral, resp. rate 18, height 5\' 2"  (1.575 m), weight 193 lb 3.2 oz (87.635 kg), SpO2 98 %.  Filed Weights   08/04/15 0500 08/05/15 0400 08/06/15 0500  Weight: 190 lb 14.7 oz (86.6 kg) 189 lb 3.2 oz (85.821 kg) 193 lb 3.2 oz (87.635 kg)    Labs & Radiologic Studies     CBC  Recent Labs  08/06/15 0310 08/07/15 0219  WBC 7.5 7.8  HGB 10.0* 9.5*  HCT 31.5* 30.5*  MCV 93.2 93.6  PLT 335 [id number]   Basic Metabolic Panel  Recent Labs  08/06/15 0310 08/07/15 0219  NA 131* 130*  K 4.1 4.1  CL 102 100*  CO2 22 23  GLUCOSE 109* 117*  BUN 7  10  CREATININE 0.69 0.75  CALCIUM 8.5* 8.5*   Liver Function Tests No results for input(s): AST, ALT, ALKPHOS, BILITOT, PROT, ALBUMIN in the last 72 hours. No results for input(s): LIPASE, AMYLASE in the last 72 hours. Cardiac Enzymes No results for input(s): CKTOTAL, CKMB, CKMBINDEX, TROPONINI in the last 72 hours. BNP Invalid input(s): POCBNP D-Dimer No results for input(s): DDIMER in the last 72 hours. Hemoglobin A1C No results for input(s): HGBA1C in the last 72 hours. Fasting Lipid Panel No results for input(s): CHOL, HDL, LDLCALC, TRIG, CHOLHDL, LDLDIRECT in the last 72 hours. Thyroid Function Tests No results for input(s): TSH, T4TOTAL, T3FREE, THYROIDAB in the last 72 hours.  Invalid input(s): FREET3  No results found.  Disposition   Pt is being discharged home today in good condition.  Follow-up Plans & Appointments    Follow-up Information    Follow up with Collene Gobble., MD On 08/30/2015.   Specialty:  Pulmonary Disease   Why:  215pm    Contact information:   [address] [place] [address]  [phone]       Follow up with Tarri Fuller, PA-C On 08/15/2015.   Specialties:  Physician Assistant, Radiology, Interventional Cardiology   Why:  2:00 PM (Dr. Jacalyn Lefevre PA)   Contact information:   [address] [place] [address] [phone]       Follow up with Kirk Ruths, MD On 09/09/2015.   Specialty:  Cardiology   Why:  11:30 AM    Contact information:   [address] [place] [address] [phone]      Discharge Instructions    Amb Referral to Cardiac Rehabilitation    Complete by:  As directed   Diagnosis:   Myocardial Infarction Comment - to [place] [facility]             Discharge Medications   Current Discharge Medication List    START taking these medications   Details  aspirin 81 MG chewable tablet Chew 1 tablet (81 mg total) by mouth daily.    atorvastatin (LIPITOR) 80 MG tablet Take 1  tablet (80 mg total) by mouth daily at 6 PM. Qty: 30 tablet, Refills: 5    clopidogrel (PLAVIX) 75 MG tablet Take 1 tablet (75 mg total) by mouth daily. Qty: 30 tablet, Refills: 11    furosemide (LASIX) 20 MG tablet Take 1 tablet (20 mg total) by mouth as needed for fluid or edema (shortness of breath). Qty: 30 tablet, Refills: 5    lisinopril (PRINIVIL,ZESTRIL) 2.5 MG tablet Take 1 tablet (2.5 mg total) by mouth daily. Qty: 30 tablet, Refills: 5    metoprolol tartrate (LOPRESSOR) 25 MG tablet Take 1 tablet (25 mg total) by mouth 2 (two) times daily. Qty: 60 tablet, Refills: 5      CONTINUE these medications which have CHANGED   Details  !! apixaban (ELIQUIS) 5 MG TABS tablet Take 1 tablet (5 mg total) by mouth 2 (two) times daily. Qty: 60 tablet, Refills: 5    !! apixaban (ELIQUIS) 5 MG TABS tablet Take 1 tablet (5 mg total) by mouth 2 (two) times daily. Qty: 60 tablet, Refills: 0 - For Free 30 day supply     !! - Potential duplicate medications found. Please discuss with provider.    CONTINUE these medications which have NOT CHANGED   Details  alendronate (FOSAMAX) 70 MG tablet Take 70 mg by mouth once a week. Take with a full glass of water on an empty stomach.    benzonatate (TESSALON) 200 MG capsule Take 200 mg by mouth as needed for cough.    Esomeprazole Magnesium (NEXIUM PO) Take 1 capsule by mouth as needed.    FLOVENT HFA 110 MCG/ACT inhaler Place 2 puffs into the nose 2 (two) times daily.    hyoscyamine (ANASPAZ) 0.125 MG TBDP disintergrating tablet Place 0.125 mg under the tongue as needed.    methocarbamol (ROBAXIN) 750 MG tablet Take 750 mg by mouth as needed for muscle spasms.    montelukast (SINGULAIR) 10 MG tablet Take 1 tablet by mouth daily.    VENTOLIN HFA 108 (90 BASE) MCG/ACT inhaler 2 puffs 4 (four) times daily.      STOP taking these medications     meloxicam (MOBIC) 7.5 MG tablet      valsartan-hydrochlorothiazide (DIOVAN-HCT) 320-25 MG per  tablet          Aspirin prescribed at discharge?  Yes High Intensity Statin Prescribed? (Lipitor 40-80mg  or Crestor 20-40mg ): Yes Beta Blocker Prescribed? Yes For EF 45% or less, Was  ACEI/ARB Prescribed? Yes ADP Receptor Inhibitor Prescribed? (i.e. Plavix etc.-Includes Medically Managed Patients): Yes For EF <40%, Aldosterone Inhibitor Prescribed? No: BP would not tolerate  Was EF assessed during THIS hospitalization? Yes Was Cardiac Rehab II ordered? (Included Medically managed Patients): No:    Outstanding Labs/Studies   Check CBC at time of post hospital f/u.  Bronchoscopy + lung nodule biopsy (f/u arranged with Pulmonology)   Duration of Discharge Encounter   Greater than 30 minutes including physician time.  Alveta Heimlich, BRITTAINY NP 08/08/2015, 11:35 AM

## 2015-08-08 NOTE — Care Management Important Message (Signed)
Important Message  Patient Details  Name: Heather Howard MRN: [id number] Date of Birth: 06/27/1940   Medicare Important Message Given:  Yes    Dawayne Patricia, RN 08/08/2015, 11:55 AM

## 2015-08-08 NOTE — Progress Notes (Signed)
Patient Profile: 75 year-old female with history of permanent atrial fibrillation on Eliquis, hypertension, hyperlipidemia, COPD and GERD presenting as a transferred  from [facility] 08/03/15 for non-ST elevation myocardial infarction. Chest CT also showed a right lung nodule, concerning for malignancy.   Subjective: Denies any chest pain. No dyspnea. Ambulating w/o difficulty. Only complaint is persistent cough.   Objective: Vital signs in last 24 hours: Temp:  [98 F (36.7 C)-98.6 F (37 C)] 98 F (36.7 C) (02/20 0331) Pulse Rate:  [70-77] 70 (02/20 0331) Resp:  [18-20] 18 (02/20 0331) BP: (105-126)/(53-69) 126/69 mmHg (02/20 0331) SpO2:  [93 %-100 %] 98 % (02/20 0331) Last BM Date: 08/06/15 (per patient report)  Intake/Output from previous day: 02/19 0701 - 02/20 0700 In: 480 [P.O.:480] Out: 600 [Urine:600] Intake/Output this shift:    Medications Current Facility-Administered Medications  Medication Dose Route Frequency Provider Last Rate Last Dose  . 0.9 %  sodium chloride infusion  250 mL Intravenous PRN Jettie Booze, MD      . acetaminophen (TYLENOL) tablet 650 mg  650 mg Oral Q4H PRN Jettie Booze, MD   650 mg at 08/06/15 0250  . albuterol (PROVENTIL) (2.5 MG/3ML) 0.083% nebulizer solution 2.5 mg  2.5 mg Inhalation Q4H PRN Lelon Perla, MD      . apixaban Arne [place]) tablet 5 mg  5 mg Oral BID Minus Breeding, MD   5 mg at 08/07/15 2109  . aspirin chewable tablet 81 mg  81 mg Oral Daily Jettie Booze, MD   81 mg at 08/07/15 1148  . atorvastatin (LIPITOR) tablet 80 mg  80 mg Oral q1800 Brittainy Erie Noe, PA-C   80 mg at 08/07/15 1715  . benzonatate (TESSALON) capsule 200 mg  200 mg Oral TID PRN Consuelo Pandy, PA-C   200 mg at 08/07/15 2113  . budesonide (PULMICORT) nebulizer solution 0.25 mg  0.25 mg Nebulization BID Brittainy M Simmons, PA-C   0.25 mg at 08/07/15 1913  . clopidogrel (PLAVIX) tablet 75 mg  75 mg Oral Daily Jettie Booze, MD   75 mg at 08/07/15 1147  . diphenhydrAMINE (BENADRYL) capsule 25 mg  25 mg Oral Q6H PRN Rogelia Mire, NP   25 mg at 08/07/15 0034  . guaiFENesin-codeine 100-10 MG/5ML solution 10 mL  10 mL Oral Q6H PRN Pixie Casino, MD   10 mL at 08/06/15 2146  . lisinopril (PRINIVIL,ZESTRIL) tablet 2.5 mg  2.5 mg Oral Daily Minus Breeding, MD   2.5 mg at 08/07/15 1147  . metoprolol tartrate (LOPRESSOR) tablet 25 mg  25 mg Oral BID Thompson Grayer, MD   25 mg at 08/07/15 2109  . montelukast (SINGULAIR) tablet 10 mg  10 mg Oral Daily Brittainy Erie Noe, PA-C   10 mg at 08/07/15 1147  . ondansetron (ZOFRAN) injection 4 mg  4 mg Intravenous Q6H PRN Jettie Booze, MD      . pantoprazole (PROTONIX) EC tablet 40 mg  40 mg Oral Daily Brittainy Erie Noe, PA-C   40 mg at 08/07/15 1148  . sodium chloride flush (NS) 0.9 % injection 3 mL  3 mL Intravenous Q12H Jettie Booze, MD   3 mL at 08/07/15 2114  . sodium chloride flush (NS) 0.9 % injection 3 mL  3 mL Intravenous PRN Jettie Booze, MD      . zinc oxide (BALMEX) 11.3 % cream   Topical BID Alphia Moh, MD   1 application at  08/07/15 2114  . zolpidem (AMBIEN) tablet 5 mg  5 mg Oral QHS PRN Minus Breeding, MD        PE: General appearance: alert, cooperative and no distress Neck: no carotid bruit and no JVD Lungs: clear to auscultation bilaterally Heart: irregularly irregular rhythm and regular rate Extremities: no LEE Pulses: 2+ and symmetric Skin: warm and dry Neurologic: Grossly normal  Lab Results:   Recent Labs  08/06/15 0310 08/07/15 0219  WBC 7.5 7.8  HGB 10.0* 9.5*  HCT 31.5* 30.5*  PLT 335 328   BMET  Recent Labs  08/06/15 0310 08/07/15 0219  NA 131* 130*  K 4.1 4.1  CL 102 100*  CO2 22 23  GLUCOSE 109* 117*  BUN 7 10  CREATININE 0.69 0.75  CALCIUM 8.5* 8.5*    Assessment/Plan  Principal Problem:   NSTEMI (non-ST elevated myocardial infarction) (HCC) Active Problems:   Atrial  fibrillation (HCC)   Essential hypertension   COPD (chronic obstructive pulmonary disease) (HCC)   Lung nodule-Rt middle lobe   Hypokalemia   Anemia   1.CAD: s/p NSTEMI. LHC 08/05/15 showed Mid LAD lesion, 70% stenosed, calcified irregular lesion significant by FFR (0.73). S/p PCI + BMS. BMS chosen given need for lung nodule biopsy in the near future. She denies any recurrent CP. Continue dual antiplatelet therapy for at least 30 days. ASA and Plavix, + high intensity statin, BB and ACE-I.   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 Continue medical therapy with BB and ACE-I. Consider switching from Lopressor to long acting metoprolol vs Coreg, given LV dysfunction. BP is too soft to initiate Aldactone at this time. Volume stable on exam. Low sodium diet.  3. COPD/ Lung Nodule: Plan is for bronchoscopy + nodule biopsy after 1 month of ASA + Plavix, given newly placed coronary BMS.   4. Permanent Atrial Fibrillation: Rate is controlled with BB. Eliquis restarted given CHA2DS2 VASc score of 5 (Embolic risk factors of age 61, hypertension, female sex and borderline diabetes mellitus).   5. HLD: LDL slightly above at 78 mg/dL. Continue high intensity statin. LDL goal is <70 mg/dL.    6. Prediabetes: Hbg A1c is 5.9. This will need to be monitored by her PCP.   7. Anemia: Admission Hgb ~10. Now at 9.5. She is on triple therapy with ASA, Plavix and Eliquis for CAD and atrial fib. Continue to monitor. Reassess H/H as an outpatient.   8. URI: viral panel + for rinovirus. Continue supportive care.    LOS: 5 days    Brittainy M. Rosita Fire, PA-C 08/08/2015 8:40 AM

## 2015-08-08 NOTE — Telephone Encounter (Signed)
Patient has permanent atrial fib on eliquis and also recently diagnosed with coronary artery disease s/p bare-metal stent. Family called after hour service to clarify which medication she is supposed to be on. I have reviewed the patient's chart, she is supposed to be on aspirin, Plavix, and eliquis. After one month of aspirin and Plavix, patient can potentially temporarily stop them if need bronchoscopy and biopsy.   Family also requested a prescription for nitroglycerin to send her pharmacy, I have sent a prescription.  Hilbert Corrigan PA Pager: [phone]

## 2015-08-08 NOTE — Progress Notes (Deleted)
Discharge Summary    Patient ID: Heather Howard,  MRN: [id number], DOB/AGE: [birth year] 75 y.o.  Admit date: 08/03/2015 Discharge date: 08/08/2015  Primary Care Provider: PERRY,LAWRENCE EDWARD Primary Cardiologist: Dr. Stanford Breed  Discharge Diagnoses    Principal Problem:   NSTEMI (non-ST elevated myocardial infarction) [facility]) Active Problems:   Atrial fibrillation ([place])   Essential hypertension   COPD (chronic obstructive pulmonary disease) (HCC)   Lung nodule-Rt middle lobe   Hypokalemia   Anemia   Allergies Allergies  Allergen Reactions  . Carbocaine [Mepivacaine Hcl] Swelling  . Penicillins Swelling    Diagnostic Studies/Procedures    LHC 08/05/15  Procedures    Coronary Stent Intervention   Intravascular Pressure Wire/FFR Study   Left Heart Cath and Coronary Angiography    Conclusion     There is severe left ventricular systolic dysfunction.  Mid LAD lesion, 70% stenosed, calcified irregular lesion significant by FFR (0.73). Post intervention with a 3.5 x 15 vision bare-metal stent, there is a 0% residual stenosis.     2D Echo 08/04/15 Study Conclusions  - Left ventricle: The cavity size was normal. Systolic function was moderately reduced. The estimated ejection fraction was in the range of 35% to 40%. There is akinesis of the mid inferoseptal, apical septal and apical myocardium. There is akinesis of the mid anteroseptal and apical anteriorl myocardium. . There is akinesis of the apicallateral myocardium. There is akinesis of the mid-apicalinferolateral and inferior myocardium. - Aortic valve: There was mild regurgitation. - Mitral valve: There was mild regurgitation. - Left atrium: The atrium was mildly dilated. - Right ventricle: There is akinesis of the mid and apical portions of the RV. - Pulmonary arteries: PA peak pressure: 40 mm Hg (S).  Impressions:  - The right ventricular systolic pressure was increased  consistent with moderate pulmonary hypertension.    History of Present Illness     75 year-old female, followed by Dr. Stanford Breed, with history of permanent atrial fibrillation on Eliquis, hypertension, hyperlipidemia, COPD, prior history of tobacco use and GERD. No prior history of CAD. She initially presented to [facility] on 08/03/15 with a complaint of URI symptoms (cough + sore throat), along with chest, neck and jaw pain. At [facility], her EKG showed rate controlled atrial fibrillation with a new RBBB. Initial troponin was abnormal at 0.57 and second troponin rose to 1.2. D-dimer was also elevated. Subsequent Chest CT was negative for PE but showed a 1 cm speculated mass in the right middle lobe and enlarged right para tracheal lymph nodes suspicious for malignancy. She was transferred to [facility] for management of NSTEMI and further w/u regarding her abnormal chest CT.    Hospital Course     Patient arrived to [facility] night of 08/03/15. She was CP free on arrival with IV nitro. Given her upper respiratory symptoms and slightly elevated WBC, she was placed on droplet precaution protocol. Viral panel was obtained and negative for influenza. She tested positive for rhinovirus. CXR was negative for PNA. Given that she was on Eliquis, her cath was delayed until 08/05/15, to allow for Eliquis washout. She was started on IV heparin per pharmacy. Cardiac enzymes were cycled and peaked at 1.41. 2D echo showed moderately reduced EF of 35-40%. On 08/05/15, she underwent LHC per Dr. Irish Lack. She was found to have a mid LAD lesion, 70% stenosed, calcified irregular lesion significant by FFR (0.73). She underwent successful, PCI + BMS to the mid LAD. BMS was chosen given need for lung  nodule biopsy in the near future. She tolerated the procedure well and left the cath lab in stable condition. She had no recurrent CP. She had no post cath complications. She was placed on DAPT with ASA + Plavix. Eliquis was also  continued given her atrial fibrillation and CHA2DS2 VASc score of 5 (Embolic risk factors of age 75, hypertension, female sex and borderline diabetes mellitus). She was also continued on a BB and ACE-I given her LV dysfunction. High intensity statin was also prescribed given a LDL level of 78 mg/dL.   Pulmonology was consulted given her incidental chest CT findings. It was recommended that she undergo a bronchoscopy and nodule biopsy, after she has completed 1 month of DAPT given her new coronary stent. She will continue ASA and Plavix for 30 days, at which point can be held temporarily for her procedure. She is scheduled for an outpatient f/u with Dr. Lamonte Sakai on 08/30/15.   Prior to discharge, the patient was assessed by PT who felt that she was stable for discharge home w/o need for [facility] services. They recommended that she be discharged home with a rolling walker to assist with ambulation. This was arranged.  She was last seen and examined by Dr. Debara Pickett who determined she was stable for discharge home.   In addition to the meds outlined above, she was also prescribed 20 mg of Lasix, PRN, to be used if she develops any weight gain, edema or dyspnea. She will be seen in clinic for 7 day TOC f/u with Tarri Fuller, PA-C, on 08/15/15.     Consultants: Pulmonology    Discharge Vitals Blood pressure 102/60, pulse 63, temperature 98 F (36.7 C), temperature source Oral, resp. rate 18, height 5\' 2"  (1.575 m), weight 193 lb 3.2 oz (87.635 kg), SpO2 98 %.  Filed Weights   08/04/15 0500 08/05/15 0400 08/06/15 0500  Weight: 190 lb 14.7 oz (86.6 kg) 189 lb 3.2 oz (85.821 kg) 193 lb 3.2 oz (87.635 kg)    Labs & Radiologic Studies     CBC  Recent Labs  08/06/15 0310 08/07/15 0219  WBC 7.5 7.8  HGB 10.0* 9.5*  HCT 31.5* 30.5*  MCV 93.2 93.6  PLT 335 [id number]   Basic Metabolic Panel  Recent Labs  08/06/15 0310 08/07/15 0219  NA 131* 130*  K 4.1 4.1  CL 102 100*  CO2 22 23  GLUCOSE 109* 117*  BUN 7  10  CREATININE 0.69 0.75  CALCIUM 8.5* 8.5*   Liver Function Tests No results for input(s): AST, ALT, ALKPHOS, BILITOT, PROT, ALBUMIN in the last 72 hours. No results for input(s): LIPASE, AMYLASE in the last 72 hours. Cardiac Enzymes No results for input(s): CKTOTAL, CKMB, CKMBINDEX, TROPONINI in the last 72 hours. BNP Invalid input(s): POCBNP D-Dimer No results for input(s): DDIMER in the last 72 hours. Hemoglobin A1C No results for input(s): HGBA1C in the last 72 hours. Fasting Lipid Panel No results for input(s): CHOL, HDL, LDLCALC, TRIG, CHOLHDL, LDLDIRECT in the last 72 hours. Thyroid Function Tests No results for input(s): TSH, T4TOTAL, T3FREE, THYROIDAB in the last 72 hours.  Invalid input(s): FREET3  No results found.  Disposition   Pt is being discharged home today in good condition.  Follow-up Plans & Appointments    Follow-up Information    Follow up with Collene Gobble., MD On 08/30/2015.   Specialty:  Pulmonary Disease   Why:  215pm    Contact information:   [address] [place] [address]  [phone]       Follow up with Tarri Fuller, PA-C On 08/15/2015.   Specialties:  Physician Assistant, Radiology, Interventional Cardiology   Why:  2:00 PM (Dr. Jacalyn Lefevre PA)   Contact information:   [address] [place] [address] [phone]       Follow up with Kirk Ruths, MD On 09/09/2015.   Specialty:  Cardiology   Why:  11:30 AM    Contact information:   [address] [place] [address] [phone]      Discharge Instructions    Amb Referral to Cardiac Rehabilitation    Complete by:  As directed   Diagnosis:   Myocardial Infarction Comment - to [facility]             Discharge Medications   Current Discharge Medication List    START taking these medications   Details  aspirin 81 MG chewable tablet Chew 1 tablet (81 mg total) by mouth daily.    atorvastatin (LIPITOR) 80 MG tablet Take 1  tablet (80 mg total) by mouth daily at 6 PM. Qty: 30 tablet, Refills: 5    clopidogrel (PLAVIX) 75 MG tablet Take 1 tablet (75 mg total) by mouth daily. Qty: 30 tablet, Refills: 11    furosemide (LASIX) 20 MG tablet Take 1 tablet (20 mg total) by mouth as needed for fluid or edema (shortness of breath). Qty: 30 tablet, Refills: 5    lisinopril (PRINIVIL,ZESTRIL) 2.5 MG tablet Take 1 tablet (2.5 mg total) by mouth daily. Qty: 30 tablet, Refills: 5    metoprolol tartrate (LOPRESSOR) 25 MG tablet Take 1 tablet (25 mg total) by mouth 2 (two) times daily. Qty: 60 tablet, Refills: 5      CONTINUE these medications which have CHANGED   Details  !! apixaban (ELIQUIS) 5 MG TABS tablet Take 1 tablet (5 mg total) by mouth 2 (two) times daily. Qty: 60 tablet, Refills: 5    !! apixaban (ELIQUIS) 5 MG TABS tablet Take 1 tablet (5 mg total) by mouth 2 (two) times daily. Qty: 60 tablet, Refills: 0 - For Free 30 day supply     !! - Potential duplicate medications found. Please discuss with provider.    CONTINUE these medications which have NOT CHANGED   Details  alendronate (FOSAMAX) 70 MG tablet Take 70 mg by mouth once a week. Take with a full glass of water on an empty stomach.    benzonatate (TESSALON) 200 MG capsule Take 200 mg by mouth as needed for cough.    Esomeprazole Magnesium (NEXIUM PO) Take 1 capsule by mouth as needed.    FLOVENT HFA 110 MCG/ACT inhaler Place 2 puffs into the nose 2 (two) times daily.    hyoscyamine (ANASPAZ) 0.125 MG TBDP disintergrating tablet Place 0.125 mg under the tongue as needed.    methocarbamol (ROBAXIN) 750 MG tablet Take 750 mg by mouth as needed for muscle spasms.    montelukast (SINGULAIR) 10 MG tablet Take 1 tablet by mouth daily.    VENTOLIN HFA 108 (90 BASE) MCG/ACT inhaler 2 puffs 4 (four) times daily.      STOP taking these medications     meloxicam (MOBIC) 7.5 MG tablet      valsartan-hydrochlorothiazide (DIOVAN-HCT) 320-25 MG per  tablet          Aspirin prescribed at discharge?  Yes High Intensity Statin Prescribed? (Lipitor 40-80mg  or Crestor 20-40mg ): Yes Beta Blocker Prescribed? Yes For EF 45% or less, Was  ACEI/ARB Prescribed? Yes ADP Receptor Inhibitor Prescribed? (i.e. Plavix etc.-Includes Medically Managed Patients): Yes For EF <40%, Aldosterone Inhibitor Prescribed? No: BP would not tolerate  Was EF assessed during THIS hospitalization? Yes Was Cardiac Rehab II ordered? (Included Medically managed Patients): No:    Outstanding Labs/Studies   Check CBC at time of post hospital f/u.  Bronchoscopy + lung nodule biopsy (f/u arranged with Pulmonology)   Duration of Discharge Encounter   Greater than 30 minutes including physician time.  Alveta Heimlich, Kealy Lewter NP 08/08/2015, 11:35 AM

## 2015-08-08 NOTE — Evaluation (Signed)
Physical Therapy Evaluation Patient Details Name: Heather Howard MRN: [id number] DOB: 12/14/1940 Today's Date: 08/08/2015   History of Present Illness  75 year-old female with history of permanent atrial fibrillation on Eliquis, hypertension, hyperlipidemia, COPD and GERD presenting as a transfer from [facility] for non-ST elevation myocardial infarction.  Clinical Impression  Pt presenting today s/p heart cath. Ambulating and transferring well with Min Guard to Supervision with RW. PTA pt was Mod Independent relying on AD in home and community. Pt stated all surgical precautions and only needed seldom reminders t/o session. Pt will benefit from continued acute services to improve activity tolerance and general strength. Upon D/C anticipating no further PT needs, however recommending supervision for OOB mobility initially.     Follow Up Recommendations No PT follow up;Supervision for mobility/OOB    Equipment Recommendations  Rolling walker with 5" wheels    Recommendations for Other Services       Precautions / Restrictions Precautions Precautions: None Restrictions Weight Bearing Restrictions: Yes Other Position/Activity Restrictions: guarded WBing due to cath      Mobility  Bed Mobility               General bed mobility comments: pt recieved sitting up in chair  Transfers Overall transfer level: Needs assistance Equipment used: None Transfers: Sit to/from Stand Sit to Stand: Supervision         General transfer comment: stands from recliner w/o phsyical assist or stability issues  Ambulation/Gait Ambulation/Gait assistance: Supervision Ambulation Distance (Feet): 300 Feet Assistive device: Rolling walker (2 wheeled) Gait Pattern/deviations: Step-through pattern;Decreased stride length;Wide base of support Gait velocity: decreased Gait velocity interpretation: Below normal speed for age/gender General Gait Details: decreased foot clearance, however  safe with ambualtion, multiple standing rest breaks  Stairs Stairs: Yes Stairs assistance: Min guard Stair Management: One rail Left;Step to pattern Number of Stairs: 2 General stair comments: cueing to remind pt to not use the R arm on the stairs  Wheelchair Mobility    Modified Rankin (Stroke Patients Only)       Balance Overall balance assessment: Modified Independent                                           Pertinent Vitals/Pain      Home Living Family/patient expects to be discharged to:: Private residence Living Arrangements: Spouse/significant other Available Help at Discharge: Family;Available 24 hours/day Type of Home: House Home Access: Stairs to enter Entrance Stairs-Rails: None Entrance Stairs-Number of Steps: 1 Home Layout: One level Home Equipment: Walker - 2 wheels;Shower seat;Crutches;Bedside commode      Prior Function Level of Independence: Independent         Comments: occasionally would walker with cane or walker     Hand Dominance   Dominant Hand: Right    Extremity/Trunk Assessment   Upper Extremity Assessment: RUE deficits/detail RUE Deficits / Details: minimal active use and wbing due to cath precautions         Lower Extremity Assessment: Generalized weakness      Cervical / Trunk Assessment: Kyphotic  Communication   Communication: No difficulties  Cognition Arousal/Alertness: Awake/alert Behavior During Therapy: WFL for tasks assessed/performed Overall Cognitive Status: Within Functional Limits for tasks assessed                      General Comments  Exercises        Assessment/Plan    PT Assessment Patient needs continued PT services  PT Diagnosis Generalized weakness   PT Problem List Decreased activity tolerance;Decreased balance;Decreased mobility;Decreased coordination;Decreased knowledge of use of DME  PT Treatment Interventions Gait training;Stair training;DME  instruction;Functional mobility training;Therapeutic activities;Therapeutic exercise;Balance training   PT Goals (Current goals can be found in the Care Plan section) Acute Rehab PT Goals Patient Stated Goal: go home today PT Goal Formulation: With patient Time For Goal Achievement: 08/18/15 Potential to Achieve Goals: Good    Frequency Min 3X/week   Barriers to discharge        Co-evaluation               End of Session   Activity Tolerance: Patient tolerated treatment well Patient left: in bed;with call bell/phone within reach;with family/visitor present Nurse Communication: Mobility status         Time: [id number] PT Time Calculation (min) (ACUTE ONLY): 14 min   Charges:   PT Evaluation $PT Eval Low Complexity: 1 Procedure     PT G Codes:        Ara Kussmaul 2015/08/14, 4:17 PM Ara Kussmaul, Student Physical Therapist Acute Rehab [phone]

## 2015-08-15 ENCOUNTER — Ambulatory Visit (INDEPENDENT_AMBULATORY_CARE_PROVIDER_SITE_OTHER): Payer: Medicare HMO | Admitting: Physician Assistant

## 2015-08-15 ENCOUNTER — Encounter: Payer: Self-pay | Admitting: Physician Assistant

## 2015-08-15 VITALS — BP 140/70 | HR 60 | Ht 62.0 in | Wt 185.6 lb

## 2015-08-15 DIAGNOSIS — I10 Essential (primary) hypertension: Secondary | ICD-10-CM | POA: Diagnosis not present

## 2015-08-15 DIAGNOSIS — I481 Persistent atrial fibrillation: Secondary | ICD-10-CM | POA: Diagnosis not present

## 2015-08-15 DIAGNOSIS — I4819 Other persistent atrial fibrillation: Secondary | ICD-10-CM

## 2015-08-15 DIAGNOSIS — I2510 Atherosclerotic heart disease of native coronary artery without angina pectoris: Secondary | ICD-10-CM | POA: Insufficient documentation

## 2015-08-15 DIAGNOSIS — I2583 Coronary atherosclerosis due to lipid rich plaque: Secondary | ICD-10-CM

## 2015-08-15 MED ORDER — LISINOPRIL 5 MG PO TABS: 5.0000 mg | ORAL_TABLET | Freq: Every day | ORAL | Status: DC

## 2015-08-15 NOTE — Patient Instructions (Addendum)
Medication Instructions:  Your physician has recommended you make the following change in your medication:  STOP Aspirin on 09/03/15  INCREASE Lisinopril to 5mg  daily. An Rx has been sent to your pharmacy  CONTINUE all other medications as prescribed   Labwork: None ordered  Testing/Procedures: None ordered  Follow-Up: Follow up as planned in March 2017 with Dr.Crenshaw  Any Other Special Instructions Will Be Listed Below (If Applicable). Your physician has requested that you regularly monitor and record your blood pressure readings at home. Please use the same machine at the same time of day to check your readings.  Your blood pressure goal is to be below 130/80      If you need a refill on your cardiac medications before your next appointment, please call your pharmacy.

## 2015-08-15 NOTE — Progress Notes (Signed)
Patient ID: Heather Howard, female   DOB: August 06, 1940, 75 y.o.   MRN: [id number]    Date:  08/15/2015   ID:  [name], DOB 03/08/1941, MRN [id number]  PCP:  Lillard Anes, MD  Primary Cardiologist:  Stanford Breed  Chief complaint: Post hospital follow-up   History of Present Illness: Heather Howard is a 75 y.o. female  75 year-old female with history of permanent atrial fibrillation on Eliquis, hypertension, hyperlipidemia, COPD, prior history of tobacco use and GERD. No prior history of CAD. She initially presented to [facility] on 08/03/15 with a complaint of URI symptoms (cough + sore throat), along with chest, neck and jaw pain. At [facility], her EKG showed rate controlled atrial fibrillation with a new RBBB. Initial troponin was abnormal at 0.57 and second troponin rose to 1.2. D-dimer was also elevated. Subsequent Chest CT was negative for PE but showed a 1 cm speculated mass in the right middle lobe and enlarged right para tracheal lymph nodes suspicious for malignancy. She was transferred to [facility] for management of NSTEMI and further w/u regarding her abnormal chest CT.   Patient arrived to [facility] night of 08/03/15. She was CP free on arrival with IV nitro. Given her upper respiratory symptoms and slightly elevated WBC, she was placed on droplet precaution protocol. Viral panel was obtained and negative for influenza. She tested positive for rhinovirus. CXR was negative for PNA. Given that she was on Eliquis, her cath was delayed until 08/05/15, to allow for Eliquis washout. She was started on IV heparin. Cardiac enzymes were cycled and peaked at 1.41. 2D echo showed moderately reduced EF of 35-40%. On 08/05/15, she underwent LHC per Dr. Irish Lack which revealed a mid LAD lesion, 70% stenosed, calcified irregular lesion significant by FFR (0.73). She underwent successful, PCI + BMS to the mid LAD. BMS was chosen given need for lung nodule biopsy in the near future.   She was placed on DAPT  with ASA + Plavix. Eliquis was also continued given her atrial fibrillation and CHA2DS2 VASc score of 5 (Embolic risk factors of age 53, hypertension, female sex and borderline diabetes mellitus). She was also continued on a BB and ACE-I given her LV dysfunction. High intensity statin was also prescribed given a LDL level of 78 mg/dL.   Pulmonology was consulted given her incidental chest CT findings. It was recommended that she undergo a bronchoscopy and nodule biopsy, after she has completed 1 month of DAPT given her new coronary stent. She will continue ASA and Plavix for 30 days, at which point can be held temporarily for her procedure. She is scheduled for an outpatient f/u with Dr. Lamonte Sakai on 08/30/15.   Heather Howard is here for posthospital follow-up.  She reports doing well. As of Saturday she started feeling much better and feel she turned the corner at that point as far as the infection with rhinovirus.  Her weight is decreased from 193-185 pounds on our scales.  The patient currently denies nausea, vomiting, fever, chest pain, shortness of breath, orthopnea, dizziness, PND, cough, congestion, abdominal pain, hematochezia, melena, lower extremity edema, claudication.  Wt Readings from Last 3 Encounters:  08/15/15 185 lb 9.6 oz (84.188 kg)  08/06/15 193 lb 3.2 oz (87.635 kg)  01/03/15 195 lb 11.2 oz (88.769 kg)     Past Medical History  Diagnosis Date  . Hypertension   . Hyperlipidemia   . COPD (chronic obstructive pulmonary disease) ([place])   . Depression   . IBS (irritable  bowel syndrome)   . GERD (gastroesophageal reflux disease)   . Fibrocystic breast disease   . Asthma   . Anemia   . Atrial fibrillation, permanent ([place])   . Anticoagulation adequate     on Eliquis    Current Outpatient Prescriptions  Medication Sig Dispense Refill  . alendronate (FOSAMAX) 70 MG tablet Take 70 mg by mouth once a week. Take with a full glass of water on an empty stomach.    Marland Kitchen ammonium lactate  (LAC-HYDRIN) 12 % lotion APPLY TO ARMS BID  11  . apixaban (ELIQUIS) 5 MG TABS tablet Take 1 tablet (5 mg total) by mouth 2 (two) times daily. 60 tablet 5  . atorvastatin (LIPITOR) 80 MG tablet Take 1 tablet (80 mg total) by mouth daily at 6 PM. 30 tablet 5  . benzonatate (TESSALON) 200 MG capsule Take 200 mg by mouth as needed for cough.    . clopidogrel (PLAVIX) 75 MG tablet TK 1 T PO  QD  11  . Esomeprazole Magnesium (NEXIUM PO) Take 1 capsule by mouth as needed (REFLUX).     Marland Kitchen FLOVENT HFA 110 MCG/ACT inhaler Place 2 puffs into the nose 2 (two) times daily.    . furosemide (LASIX) 20 MG tablet TK 1 T PO D PRF FLUID OR EDEMA  1  . hyoscyamine (ANASPAZ) 0.125 MG TBDP disintergrating tablet Place 0.125 mg under the tongue as needed (THROAT SPASMS).     Marland Kitchen lisinopril (PRINIVIL,ZESTRIL) 5 MG tablet Take 1 tablet (5 mg total) by mouth daily. 30 tablet 5  . methocarbamol (ROBAXIN) 750 MG tablet Take 750 mg by mouth as needed for muscle spasms.    . metoprolol tartrate (LOPRESSOR) 25 MG tablet Take 1 tablet (25 mg total) by mouth 2 (two) times daily. 60 tablet 5  . montelukast (SINGULAIR) 10 MG tablet Take 1 tablet by mouth daily.    . nitroGLYCERIN (NITROSTAT) 0.4 MG SL tablet Place 1 tablet (0.4 mg total) under the tongue every 5 (five) minutes as needed for chest pain. 25 tablet 3  . triamcinolone cream (KENALOG) 0.1 % APPLY BID PRN FOR ITCHY AREAS UP TO 2 WEEKS  1  . VENTOLIN HFA 108 (90 BASE) MCG/ACT inhaler 2 puffs 4 (four) times daily.     No current facility-administered medications for this visit.    Allergies:    Allergies  Allergen Reactions  . Carbocaine [Mepivacaine Hcl] Swelling  . Penicillins Swelling    Social History:  The patient  reports that she quit smoking about 50 years ago. She does not have any smokeless tobacco history on file. She reports that she drinks alcohol. She reports that she does not use illicit drugs.   Family history:   Family History  Problem Relation  Age of Onset  . Diabetes Mellitus I Mother   . Heart disease Mother   . Thrombosis Father   . Diabetes Mother   . Heart attack Neg Hx   . Hypertension Mother   . Stroke Paternal Grandmother     ROS:  Please see the history of present illness.  All other systems reviewed and negative.   PHYSICAL EXAM: VS:  BP 140/70 mmHg  Pulse 60  Ht 5\' 2"  (1.575 m)  Wt 185 lb 9.6 oz (84.188 kg)  BMI 33.94 kg/m2 Obese well developed, in no acute distress HEENT: Pupils are equal round react to light accommodation extraocular movements are intact.  Neck: no JVDNo cervical lymphadenopathy. Cardiac: Iregular rate and rhythm without  murmurs rubs or gallops. Lungs:  clear to auscultation bilaterally, no wheezing, rhonchi or rales Abd: soft, nontender, positive bowel sounds all quadrants, no hepatosplenomegaly Ext: no lower extremity edema.  2+ radial and dorsalis pedis pulses. Skin: warm and dry Neuro:  Grossly normal    ASSESSMENT AND PLAN:  Problem List Items Addressed This Visit    Essential hypertension   Relevant Medications   furosemide (LASIX) 20 MG tablet   lisinopril (PRINIVIL,ZESTRIL) 5 MG tablet   Coronary artery disease due to lipid rich plaque   Relevant Medications   furosemide (LASIX) 20 MG tablet   lisinopril (PRINIVIL,ZESTRIL) 5 MG tablet   Atrial fibrillation (HCC) - Primary   Relevant Medications   furosemide (LASIX) 20 MG tablet   lisinopril (PRINIVIL,ZESTRIL) 5 MG tablet      Heather Howard appears to be doing well. She appears euvolemic. Blood pressures a little bit elevated, I'm going to further titrate her lisinopril 5 mg daily.  She has not required any Lasix but has it if needed we reviewed the weight gain schedule as to when to take it. Is currently on aspirin, Plavix, and requests. She will stop the aspirin as of March 18. She is scheduled for a PET scan on March 3 to evaluate her lung mass.  If she needs a biopsy she will need to stop her Plavix for at least 5 days  but no sooner than March 18. Follow-up is already been arranged with Dr. Stanford Breed

## 2015-08-17 ENCOUNTER — Ambulatory Visit: Payer: Commercial Managed Care - HMO | Admitting: Physician Assistant

## 2015-08-19 ENCOUNTER — Telehealth: Payer: Self-pay | Admitting: Physician Assistant

## 2015-08-19 ENCOUNTER — Ambulatory Visit (HOSPITAL_COMMUNITY)
Admission: RE | Admit: 2015-08-19 | Discharge: 2015-08-19 | Disposition: A | Discharge status: Home / Self Care | Payer: Medicare HMO | Source: Ambulatory Visit | Attending: Emergency Medicine | Admitting: Emergency Medicine

## 2015-08-19 DIAGNOSIS — Z79899 Other long term (current) drug therapy: Secondary | ICD-10-CM | POA: Insufficient documentation

## 2015-08-19 DIAGNOSIS — R918 Other nonspecific abnormal finding of lung field: Secondary | ICD-10-CM | POA: Diagnosis not present

## 2015-08-19 LAB — GLUCOSE, CAPILLARY: Glucose-Capillary: 99 mg/dL (ref 65–99)

## 2015-08-19 IMAGING — PT NM PET TUM IMG INITIAL (PI) SKULL BASE T - THIGH
1 series · 1 of 1 positions shown · non-contrast
Comparison: CT thorax 08/03/2015

CLINICAL DATA: Initial treatment strategy for lung masses.

EXAM:
NUCLEAR MEDICINE PET SKULL BASE TO THIGH
TECHNIQUE: 9.1 mCi F-18 FDG was injected intravenously. Full-ring PET imaging
was performed from the skull base to thigh after the radiotracer. CT
data was obtained and used for attenuation correction and anatomic
localization.
FASTING BLOOD GLUCOSE:  Value: 99 mg/dl

[Series 1032: results mm oncology reading · 5.0mm · 0.77mm/px · 1 of 1 slices shown]
[im 1/1]
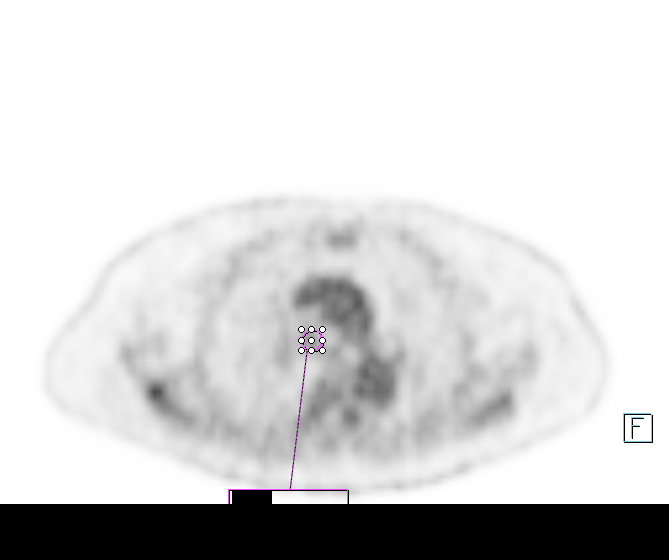

[1 of 1 positions shown; findings below may reference images not displayed]

FINDINGS: NECK

No hypermetabolic lymph nodes back and sphincter occur in the neck.

CHEST

Small RIGHT upper lobe pulmonary nodule measures 7 mm compared to 9
mm (image 68, series 4). This may relate to differing technique. No
associated metabolic activity.

Mild metabolic activity associated RIGHT lower paratracheal lymph
node with SUV max of 2.4.

ABDOMEN/PELVIS

There is a calcified central mesenteric mass measuring 3.5 cm (image
133, series 4) without associated metabolic activity. No abnormal
metabolic activity in the liver. The adrenal glands and pancreas
spleen are normal. No hypermetabolic abdominal or pelvic lymph
nodes.

No abnormal metabolic activity in the bowel.  Post hysterectomy.

SKELETON

No FDG evidence skeletal metastasis.
IMPRESSION: 1. No significant metabolic activity associated with the RIGHT upper
lobe nodule of concern. Recommend follow-up CT of the thorax without
contrast in 1-3 months to demonstrate stability or resolution.
2. Mild metabolic activity of RIGHT lower paratracheal lymph node.
Favor reactive node .
3. Calcified central mesenteric mass without metabolic activity.
Findings could represent prior infection in the mesentery. Cannot
exclude a carcinoid metastasis. No additional evidence of carcinoma
primary or metastasis.

## 2015-08-19 MED ORDER — FLUDEOXYGLUCOSE F - 18 (FDG) INJECTION
9.1800 | Freq: Once | INTRAVENOUS | Status: AC | PRN
  Administered 2015-08-19: 9.18 via INTRAVENOUS

## 2015-08-19 NOTE — Telephone Encounter (Signed)
Pt had a heart attack on 08-03-15,she also had the Rhino virus. Pt now has sore throat,coughing up stuff,chills and temperature of 99.5.She have tried to contact her primary doctor. Please call to advise.

## 2015-08-19 NOTE — Telephone Encounter (Signed)
         Pt had a heart attack on 08-03-15,she also had the Rhino virus. Pt now has sore throat,coughing up stuff,chills and temperature of 99.5.She have tried to contact her primary doctor. Please call to advise.        Advised patient to go to her near by Urgent Care to be seen and treated for her symptoms

## 2015-08-30 ENCOUNTER — Ambulatory Visit (INDEPENDENT_AMBULATORY_CARE_PROVIDER_SITE_OTHER): Payer: Medicare HMO | Admitting: Emergency Medicine

## 2015-08-30 ENCOUNTER — Encounter: Payer: Self-pay | Admitting: Emergency Medicine

## 2015-08-30 VITALS — BP 114/72 | HR 89 | Wt 189.0 lb

## 2015-08-30 DIAGNOSIS — J4520 Mild intermittent asthma, uncomplicated: Secondary | ICD-10-CM | POA: Diagnosis not present

## 2015-08-30 DIAGNOSIS — R911 Solitary pulmonary nodule: Secondary | ICD-10-CM | POA: Diagnosis not present

## 2015-08-30 NOTE — Assessment & Plan Note (Addendum)
Severity unclear. Much of her current syndrome could also relate to upper airway irritation, allergic rhinitis, possible GERD as well. I would like to perform pulmonary function testing to clearly define her degree of airflow obstruction. We will continue to treat her allergic rhinitis aggressively. Depending on the results of her PFT we may decide to stop her inhaled corticosteroid and albuterol.

## 2015-08-30 NOTE — Patient Instructions (Signed)
Please continue all of your medications as you have been taking them including aspirin and Plavix We will perform a CT scan of your chest in mid August 2017 to compare with your prior We will perform full pulmonary function testing on the day of her next appointment Please follow with Dr. Lamonte Sakai next available with full PFT.

## 2015-08-30 NOTE — Progress Notes (Signed)
Subjective:    Patient ID: Heather Howard, female    DOB: 1940-07-03, 75 y.o.   MRN: [id number]  HPI 75 year old minimal former smoker with a history of hypertension, ? Asthma / COPD, atrial fibrillation and coronary artery disease. She was admitted to the hospital in mid February with a non-ST elevation MI.  A CT scan of her chest was performed at [facility] on 08/03/15 that showed slightly spiculated right upper lobe nodule as well as some right. Tracheal lymphadenopathy. She underwent left heart cath with a bare metal stent placed in a mid LAD lesion on 08/05/15. She was treated with aspirin and Plavix during the interval 30 days with plans to follow-up here to revisit her for a nodule and lymphadenopathy. In the interim a PET scan has been performed that I personally reviewed on 08/19/15.   Also she is on flovent bid, uses ventolin on a schedule. She misses it when she doesn't take it. Note she was just started on lisinopril. Never had spirometry to her knowledge.    Review of Systems  Past Medical History  Diagnosis Date  . Hypertension   . Hyperlipidemia   . COPD (chronic obstructive pulmonary disease) ([place])   . Depression   . IBS (irritable bowel syndrome)   . GERD (gastroesophageal reflux disease)   . Fibrocystic breast disease   . Asthma   . Anemia   . Atrial fibrillation, permanent ([place])   . Anticoagulation adequate     on Eliquis     Family History  Problem Relation Age of Onset  . Diabetes Mellitus I Mother   . Heart disease Mother   . Thrombosis Father   . Diabetes Mother   . Heart attack Neg Hx   . Hypertension Mother   . Stroke Paternal Grandmother      Social History   Social History  . Marital Status: Married    Spouse Name: N/A  . Number of Children: 3  . Years of Education: N/A   Occupational History  . Not on file.   Social History Main Topics  . Smoking status: Former Smoker    Quit date: 09/20/1964  . Smokeless tobacco: Not on file  . Alcohol  Use: 0.0 oz/week    0 Standard drinks or equivalent per week     Comment: Rare  . Drug Use: No  . Sexual Activity: Not on file   Other Topics Concern  . Not on file   Social History Narrative    Allergies  Allergen Reactions  . Carbocaine [Mepivacaine Hcl] Swelling  . Penicillins Swelling     Outpatient Prescriptions Prior to Visit  Medication Sig Dispense Refill  . alendronate (FOSAMAX) 70 MG tablet Take 70 mg by mouth once a week. Take with a full glass of water on an empty stomach.    Marland Kitchen ammonium lactate (LAC-HYDRIN) 12 % lotion APPLY TO ARMS BID  11  . apixaban (ELIQUIS) 5 MG TABS tablet Take 1 tablet (5 mg total) by mouth 2 (two) times daily. 60 tablet 5  . atorvastatin (LIPITOR) 80 MG tablet Take 1 tablet (80 mg total) by mouth daily at 6 PM. 30 tablet 5  . benzonatate (TESSALON) 200 MG capsule Take 200 mg by mouth as needed for cough.    . clopidogrel (PLAVIX) 75 MG tablet TK 1 T PO  QD  11  . Esomeprazole Magnesium (NEXIUM PO) Take 1 capsule by mouth as needed (REFLUX).     Marland Kitchen FLOVENT HFA 110 MCG/ACT  inhaler Place 2 puffs into the nose 2 (two) times daily.    . furosemide (LASIX) 20 MG tablet TK 1 T PO D PRF FLUID OR EDEMA  1  . hyoscyamine (ANASPAZ) 0.125 MG TBDP disintergrating tablet Place 0.125 mg under the tongue as needed (THROAT SPASMS).     Marland Kitchen lisinopril (PRINIVIL,ZESTRIL) 5 MG tablet Take 1 tablet (5 mg total) by mouth daily. 30 tablet 5  . methocarbamol (ROBAXIN) 750 MG tablet Take 750 mg by mouth as needed for muscle spasms.    . metoprolol tartrate (LOPRESSOR) 25 MG tablet Take 1 tablet (25 mg total) by mouth 2 (two) times daily. 60 tablet 5  . montelukast (SINGULAIR) 10 MG tablet Take 1 tablet by mouth daily.    . VENTOLIN HFA 108 (90 BASE) MCG/ACT inhaler 2 puffs 4 (four) times daily.    . nitroGLYCERIN (NITROSTAT) 0.4 MG SL tablet Place 1 tablet (0.4 mg total) under the tongue every 5 (five) minutes as needed for chest pain. 25 tablet 3  . triamcinolone cream  (KENALOG) 0.1 % APPLY BID PRN FOR ITCHY AREAS UP TO 2 WEEKS  1   No facility-administered medications prior to visit.         Objective:   Physical Exam Filed Vitals:   08/30/15 1409 08/30/15 1410  BP:  114/72  Pulse:  89  Weight: 189 lb (85.73 kg)   SpO2:  98%     08/19/15 --  COMPARISON: CT thorax 08/03/2015  FINDINGS: NECK  No hypermetabolic lymph nodes back and sphincter occur in the neck.  CHEST  Small RIGHT upper lobe pulmonary nodule measures 7 mm compared to 9 mm (image 68, series 4). This may relate to differing technique. No associated metabolic activity.  Mild metabolic activity associated RIGHT lower paratracheal lymph node with SUV max of 2.4.  ABDOMEN/PELVIS  There is a calcified central mesenteric mass measuring 3.5 cm (image 133, series 4) without associated metabolic activity. No abnormal metabolic activity in the liver. The adrenal glands and pancreas spleen are normal. No hypermetabolic abdominal or pelvic lymph nodes.  No abnormal metabolic activity in the bowel. Post hysterectomy.  SKELETON  No FDG evidence skeletal metastasis.  IMPRESSION: 1. No significant metabolic activity associated with the RIGHT upper lobe nodule of concern. Recommend follow-up CT of the thorax without contrast in 1-3 months to demonstrate stability or resolution. 2. Mild metabolic activity of RIGHT lower paratracheal lymph node. Favor reactive node . 3. Calcified central mesenteric mass without metabolic activity. Findings could represent prior infection in the mesentery. Cannot exclude a carcinoid metastasis. No additional evidence of carcinoma primary or metastasis.      Assessment & Plan:  Lung nodule- RUL Right upper lobe pulmonary nodule noted on CT scan of the chest from [facility] 08/03/15. There was a associated right peritracheal node. Surgical PET scan on 08/19/15 shows that the nodule is stable in size and is not hypermetabolic.  There is some very mild hypermetabolic uptake in the region of the right paratracheal node. At this time I believe that we can follow this with serial scans. If there is an interval change in 6 months then we will discuss possible strategy for biopsy.  Asthma Severity unclear. Much of her current syndrome could also relate to upper airway irritation, allergic rhinitis, possible GERD as well. I would like to perform pulmonary function testing to clearly define her degree of airflow obstruction. We will continue to treat her allergic rhinitis aggressively. Depending on the results of  her PFT we may decide to stop her inhaled corticosteroid and albuterol.

## 2015-08-30 NOTE — Assessment & Plan Note (Signed)
Right upper lobe pulmonary nodule noted on CT scan of the chest from [facility] 08/03/15. There was a associated right peritracheal node. Surgical PET scan on 08/19/15 shows that the nodule is stable in size and is not hypermetabolic. There is some very mild hypermetabolic uptake in the region of the right paratracheal node. At this time I believe that we can follow this with serial scans. If there is an interval change in 6 months then we will discuss possible strategy for biopsy.

## 2015-09-05 NOTE — Progress Notes (Signed)
HPI: FU atrial fibrillation. Echo 4/16 showed normal LV function, mild AI, biatrial enlargement, trace MR, mild TR. Holter 4/16 showed rate controlled atrial fibrillation. She is treated with rate control and anticoagulation. Pt recently admitted with URI and troponin abnormal. Chest CT showed no PE but mass in RML (now followed by pulmonary); cath 2/17 showed 70 LAD and EF 25-35; had PCI of LAD with BMS; Echo 2/17 showed EF 35-40, mild AI and MR, mild LAE; moderately elevated pulmonary pressure. Since last seen, Patient denies dyspnea, palpitations, syncope or pedal edema. She has had brief episodes of chest pain left breast area. It is not exertional. It increases with cough. Last 2 minutes and resolves. No radiation or associated symptoms. She has not had exertional chest pain.  Current Outpatient Prescriptions  Medication Sig Dispense Refill  . alendronate (FOSAMAX) 70 MG tablet Take 70 mg by mouth once a week. Take with a full glass of water on an empty stomach.    Marland Kitchen apixaban (ELIQUIS) 5 MG TABS tablet Take 1 tablet (5 mg total) by mouth 2 (two) times daily. 60 tablet 5  . aspirin 81 MG tablet Take 81 mg by mouth daily.    Marland Kitchen atorvastatin (LIPITOR) 80 MG tablet Take 1 tablet (80 mg total) by mouth daily at 6 PM. 30 tablet 5  . benzonatate (TESSALON) 200 MG capsule Take 200 mg by mouth as needed for cough.    . clopidogrel (PLAVIX) 75 MG tablet TK 1 T PO  QD  11  . Esomeprazole Magnesium (NEXIUM PO) Take 1 capsule by mouth as needed (REFLUX).     Marland Kitchen FLOVENT HFA 110 MCG/ACT inhaler Place 2 puffs into the nose 2 (two) times daily.    . furosemide (LASIX) 20 MG tablet TK 1 T PO D PRF FLUID OR EDEMA  1  . hyoscyamine (ANASPAZ) 0.125 MG TBDP disintergrating tablet Place 0.125 mg under the tongue as needed (THROAT SPASMS).     Marland Kitchen lisinopril (PRINIVIL,ZESTRIL) 5 MG tablet Take 1 tablet (5 mg total) by mouth daily. (Patient taking differently: Take 5 mg by mouth daily. TAKES 2.5 MG TWICE DAILY.) 30  tablet 5  . methocarbamol (ROBAXIN) 750 MG tablet Take 750 mg by mouth as needed for muscle spasms.    . metoprolol tartrate (LOPRESSOR) 25 MG tablet Take 1 tablet (25 mg total) by mouth 2 (two) times daily. 60 tablet 5  . montelukast (SINGULAIR) 10 MG tablet Take 1 tablet by mouth daily.    . VENTOLIN HFA 108 (90 BASE) MCG/ACT inhaler 2 puffs 4 (four) times daily.     No current facility-administered medications for this visit.     Past Medical History  Diagnosis Date  . Hypertension   . Hyperlipidemia   . COPD (chronic obstructive pulmonary disease) ([place])   . Depression   . IBS (irritable bowel syndrome)   . GERD (gastroesophageal reflux disease)   . Fibrocystic breast disease   . Asthma   . Anemia   . Atrial fibrillation, permanent ([place])   . Anticoagulation adequate     on Eliquis    Past Surgical History  Procedure Laterality Date  . Total knee arthroplasty Bilateral 2009  . Cesarean section    . Neck surgery    . Cholecystectomy    . Appendectomy    . Cardiac catheterization N/A 08/05/2015    Procedure: Left Heart Cath and Coronary Angiography;  Surgeon: Jettie Booze, MD;  Location: [facility];  Service: Cardiovascular;  Laterality: N/A;  . Cardiac catheterization  08/05/2015    Procedure: Coronary Stent Intervention;  Surgeon: Jettie Booze, MD;  Location: [place] [facility];  Service: Cardiovascular;;  . Cardiac catheterization  08/05/2015    Procedure: Intravascular Pressure Wire/FFR Study;  Surgeon: Jettie Booze, MD;  Location: [place] [facility];  Service: Cardiovascular;;    Social History   Social History  . Marital Status: Married    Spouse Name: N/A  . Number of Children: 3  . Years of Education: N/A   Occupational History  . Not on file.   Social History Main Topics  . Smoking status: Former Smoker    Quit date: 09/20/1964  . Smokeless tobacco: Not on file  . Alcohol Use: 0.0 oz/week    0 Standard drinks or equivalent  per week     Comment: Rare  . Drug Use: No  . Sexual Activity: Not on file   Other Topics Concern  . Not on file   Social History Narrative    Family History  Problem Relation Age of Onset  . Diabetes Mellitus I Mother   . Heart disease Mother   . Thrombosis Father   . Diabetes Mother   . Heart attack Neg Hx   . Hypertension Mother   . Stroke Paternal Grandmother     ROS: no fevers or chills, productive cough, hemoptysis, dysphasia, odynophagia, melena, hematochezia, dysuria, hematuria, rash, seizure activity, orthopnea, PND, pedal edema, claudication. Remaining systems are negative.  Physical Exam: Well-developed well-nourished in no acute distress.  Skin is warm and dry.  HEENT is normal.  Neck is supple.  Chest is clear to auscultation with normal expansion.  Cardiovascular exam is irregular Abdominal exam nontender or distended. No masses palpated. Extremities show no edema. neuro grossly intact   Electrocardiogram today shows atrial fibrillation. Normal axis. Diffuse T-wave inversion. When compared to most recent the ST changes are worse. However similar to February 17.

## 2015-09-09 ENCOUNTER — Encounter: Payer: Self-pay | Admitting: Cardiology

## 2015-09-09 ENCOUNTER — Ambulatory Visit (INDEPENDENT_AMBULATORY_CARE_PROVIDER_SITE_OTHER): Payer: Medicare HMO | Admitting: Cardiology

## 2015-09-09 VITALS — BP 148/70 | HR 74 | Ht 63.0 in | Wt 188.0 lb

## 2015-09-09 DIAGNOSIS — R079 Chest pain, unspecified: Secondary | ICD-10-CM | POA: Insufficient documentation

## 2015-09-09 DIAGNOSIS — I4891 Unspecified atrial fibrillation: Secondary | ICD-10-CM | POA: Diagnosis not present

## 2015-09-09 DIAGNOSIS — R911 Solitary pulmonary nodule: Secondary | ICD-10-CM

## 2015-09-09 DIAGNOSIS — R072 Precordial pain: Secondary | ICD-10-CM | POA: Diagnosis not present

## 2015-09-09 DIAGNOSIS — I255 Ischemic cardiomyopathy: Secondary | ICD-10-CM | POA: Insufficient documentation

## 2015-09-09 MED ORDER — METOPROLOL SUCCINATE ER 50 MG PO TB24: 50.0000 mg | ORAL_TABLET | Freq: Every day | ORAL | Status: DC

## 2015-09-09 MED ORDER — LISINOPRIL 10 MG PO TABS: 10.0000 mg | ORAL_TABLET | Freq: Every day | ORAL | Status: AC

## 2015-09-09 NOTE — Assessment & Plan Note (Signed)
Patient is in atrial fibrillation today.Continue beta blocker for rate control. Continue apixaban. Check hemoglobin.

## 2015-09-09 NOTE — Assessment & Plan Note (Signed)
Patient is status post PCI of LAD with bare-metal stent. It has been over 30 days. Discontinue aspirin but continue Plavix. Continue statin.

## 2015-09-09 NOTE — Patient Instructions (Signed)
Medication Instructions:   STOP ASPIRIN  STOP METOPROLOL TARTRATE WHEN FINISHED WITH CURRENT SUPPLY  START METOPROLOL SUCC ER 50 MG ONCE DAILY AT BEDTIME  INCREASE LISINOPRIL TO 10 MG ONCE DAILY= 2 OF THE 5 MG TABLETS ONCE DAILY   Labwork:  Your physician recommends that you return for lab work in: [place]:  Your physician recommends that you schedule a follow-up appointment in: [place]

## 2015-09-09 NOTE — Assessment & Plan Note (Signed)
Followed by pulmonary 

## 2015-09-09 NOTE — Assessment & Plan Note (Signed)
Blood pressure elevated. Increase lisinopril to 10 mg daily. Check potassium and renal function in 1 week.

## 2015-09-09 NOTE — Assessment & Plan Note (Signed)
Symptoms are atypical and last 2 minutes at a time. They increase with coughing. She does have diffuse T-wave inversion on electrocardiogram which is worse compared to the most recent electrocardiogram but similar when compared to February 17. We will treat medically for now. I will see her back in 6 weeks to make sure her symptoms are stable.

## 2015-09-09 NOTE — Assessment & Plan Note (Signed)
Patient with reduced LV function. Change metoprolol to Toprol 50 mg daily. Increase lisinopril to 10 mg daily. Titrate medications as tolerated. Repeat echocardiogram in approximately 3 months.

## 2015-09-14 DIAGNOSIS — Z6833 Body mass index (BMI) 33.0-33.9, adult: Secondary | ICD-10-CM | POA: Diagnosis not present

## 2015-09-14 DIAGNOSIS — J309 Allergic rhinitis, unspecified: Secondary | ICD-10-CM | POA: Diagnosis not present

## 2015-09-14 DIAGNOSIS — J019 Acute sinusitis, unspecified: Secondary | ICD-10-CM | POA: Diagnosis not present

## 2015-09-15 DIAGNOSIS — I4891 Unspecified atrial fibrillation: Secondary | ICD-10-CM | POA: Diagnosis not present

## 2015-09-15 LAB — CBC
HEMATOCRIT: 35.2 % — AB (ref 36.0–46.0)
HEMOGLOBIN: 11.4 g/dL — AB (ref 12.0–15.0)
MCH: 29.9 pg (ref 26.0–34.0)
MCHC: 32.4 g/dL (ref 30.0–36.0)
MCV: 92.4 fL (ref 78.0–100.0)
MPV: 8.8 fL (ref 8.6–12.4)
PLATELETS: 354 10*3/uL (ref 150–400)
RBC: 3.81 MIL/uL — AB (ref 3.87–5.11)
RDW: 15.2 % (ref 11.5–15.5)
WBC: 6.8 10*3/uL (ref 4.0–10.5)

## 2015-09-16 LAB — BASIC METABOLIC PANEL
BUN: 14 mg/dL (ref 7–25)
CHLORIDE: 104 mmol/L (ref 98–110)
CO2: 26 mmol/L (ref 20–31)
CREATININE: 0.79 mg/dL (ref 0.60–0.93)
Calcium: 9.3 mg/dL (ref 8.6–10.4)
Glucose, Bld: 91 mg/dL (ref 65–99)
POTASSIUM: 4 mmol/L (ref 3.5–5.3)
SODIUM: 139 mmol/L (ref 135–146)

## 2015-09-23 DIAGNOSIS — E785 Hyperlipidemia, unspecified: Secondary | ICD-10-CM | POA: Diagnosis not present

## 2015-09-23 DIAGNOSIS — I213 ST elevation (STEMI) myocardial infarction of unspecified site: Secondary | ICD-10-CM | POA: Diagnosis not present

## 2015-09-23 DIAGNOSIS — R7309 Other abnormal glucose: Secondary | ICD-10-CM | POA: Diagnosis not present

## 2015-09-23 DIAGNOSIS — Z6832 Body mass index (BMI) 32.0-32.9, adult: Secondary | ICD-10-CM | POA: Diagnosis not present

## 2015-09-23 DIAGNOSIS — I10 Essential (primary) hypertension: Secondary | ICD-10-CM | POA: Diagnosis not present

## 2015-09-23 DIAGNOSIS — I4891 Unspecified atrial fibrillation: Secondary | ICD-10-CM | POA: Diagnosis not present

## 2015-10-19 NOTE — Progress Notes (Signed)
HPI: FU atrial fibrillation. Patient previously seen by her primary care physician and noted to be in atrial fibrillation. Echo 4/16 showed normal LV function, mild AI, biatrial enlargement, trace MR, mild TR. Holter 4/16 showed rate controlled atrial fibrillation. We are treating with rate control and anticoagulation. Patient admitted in February 2017 with non-ST elevation myocardial infarction. Cardiac catheterization revealed a 70% LAD and ejection fraction 25-35%. She was treated with a bare metal stent. Echocardiogram February 2017 showed ejection fraction 35-40%. Mild left atrial enlargement, mild aortic and mitral regurgitation. Note a chest CT revealed a right middle lobe mass. Patient was seen by pulmonary and they recommended follow-up CT scans. Since last seen, Patient denies dyspnea on exertion, pedal edema. She has a vague discomfort in her chest that is unlike her infarct pain. It is intermittent and not related to exertion. It lasts several minutes and resolves. No associated nausea, diaphoresis or dyspnea. Not pleuritic or positional.  Current Outpatient Prescriptions  Medication Sig Dispense Refill  . alendronate (FOSAMAX) 70 MG tablet Take 70 mg by mouth once a week. Take with a full glass of water on an empty stomach.    Marland Kitchen apixaban (ELIQUIS) 5 MG TABS tablet Take 1 tablet (5 mg total) by mouth 2 (two) times daily. 60 tablet 5  . atorvastatin (LIPITOR) 80 MG tablet Take 1 tablet (80 mg total) by mouth daily at 6 PM. 30 tablet 5  . benzonatate (TESSALON) 200 MG capsule Take 200 mg by mouth as needed for cough.    . clopidogrel (PLAVIX) 75 MG tablet TK 1 T PO  QD  11  . Esomeprazole Magnesium (NEXIUM PO) Take 1 capsule by mouth as needed (REFLUX).     Marland Kitchen FLOVENT HFA 110 MCG/ACT inhaler Place 2 puffs into the nose 2 (two) times daily.    . furosemide (LASIX) 20 MG tablet TK 1 T PO D PRF FLUID OR EDEMA  1  . hyoscyamine (ANASPAZ) 0.125 MG TBDP disintergrating tablet Place 0.125 mg  under the tongue as needed (THROAT SPASMS).     Marland Kitchen lisinopril (PRINIVIL,ZESTRIL) 10 MG tablet Take 1 tablet (10 mg total) by mouth daily. 90 tablet 3  . methocarbamol (ROBAXIN) 750 MG tablet Take 750 mg by mouth as needed for muscle spasms.    . metoprolol succinate (TOPROL-XL) 50 MG 24 hr tablet Take 1 tablet (50 mg total) by mouth daily. 90 tablet 3  . montelukast (SINGULAIR) 10 MG tablet Take 1 tablet by mouth daily.    . VENTOLIN HFA 108 (90 BASE) MCG/ACT inhaler Inhale 2 puffs into the lungs 2 (two) times daily.      No current facility-administered medications for this visit.     Past Medical History  Diagnosis Date  . Hypertension   . Hyperlipidemia   . COPD (chronic obstructive pulmonary disease) ([place])   . Depression   . IBS (irritable bowel syndrome)   . GERD (gastroesophageal reflux disease)   . Fibrocystic breast disease   . Asthma   . Anemia   . Atrial fibrillation, permanent ([place])   . Anticoagulation adequate     on Eliquis    Past Surgical History  Procedure Laterality Date  . Total knee arthroplasty Bilateral 2009  . Cesarean section    . Neck surgery    . Cholecystectomy    . Appendectomy    . Cardiac catheterization N/A 08/05/2015    Procedure: Left Heart Cath and Coronary Angiography;  Surgeon: Jettie Booze, MD;  Location: [facility];  Service: Cardiovascular;  Laterality: N/A;  . Cardiac catheterization  08/05/2015    Procedure: Coronary Stent Intervention;  Surgeon: Jettie Booze, MD;  Location: [place] [facility];  Service: Cardiovascular;;  . Cardiac catheterization  08/05/2015    Procedure: Intravascular Pressure Wire/FFR Study;  Surgeon: Jettie Booze, MD;  Location: [place] [facility];  Service: Cardiovascular;;    Social History   Social History  . Marital Status: Married    Spouse Name: N/A  . Number of Children: 3  . Years of Education: N/A   Occupational History  . Not on file.   Social History Main Topics  .  Smoking status: Former Smoker    Quit date: 09/20/1964  . Smokeless tobacco: Not on file  . Alcohol Use: 0.0 oz/week    0 Standard drinks or equivalent per week     Comment: Rare  . Drug Use: No  . Sexual Activity: Not on file   Other Topics Concern  . Not on file   Social History Narrative    Family History  Problem Relation Age of Onset  . Diabetes Mellitus I Mother   . Heart disease Mother   . Thrombosis Father   . Diabetes Mother   . Heart attack Neg Hx   . Hypertension Mother   . Stroke Paternal Grandmother     ROS: no fevers or chills, productive cough, hemoptysis, dysphasia, odynophagia, melena, hematochezia, dysuria, hematuria, rash, seizure activity, orthopnea, PND, pedal edema, claudication. Remaining systems are negative.  Physical Exam: Well-developed obese in no acute distress.  Skin is warm and dry.  HEENT is normal.  Neck is supple.  Chest is clear to auscultation with normal expansion.  Cardiovascular exam is irregular Abdominal exam nontender or distended. No masses palpated. Extremities show no edema. neuro grossly intact  Electrocardiogram shows atrial fibrillation, normal axis, prior septal infarct, anterior and lateral T-wave inversion. T-wave changes improved compared to previous.

## 2015-10-24 ENCOUNTER — Encounter: Payer: Self-pay | Admitting: Cardiology

## 2015-10-24 ENCOUNTER — Ambulatory Visit (INDEPENDENT_AMBULATORY_CARE_PROVIDER_SITE_OTHER): Payer: Medicare HMO | Admitting: Cardiology

## 2015-10-24 VITALS — BP 110/78 | HR 69 | Ht 63.0 in | Wt 187.0 lb

## 2015-10-24 DIAGNOSIS — I482 Chronic atrial fibrillation: Secondary | ICD-10-CM

## 2015-10-24 DIAGNOSIS — I2510 Atherosclerotic heart disease of native coronary artery without angina pectoris: Secondary | ICD-10-CM | POA: Diagnosis not present

## 2015-10-24 DIAGNOSIS — I4821 Permanent atrial fibrillation: Secondary | ICD-10-CM

## 2015-10-24 DIAGNOSIS — I2583 Coronary atherosclerosis due to lipid rich plaque: Secondary | ICD-10-CM

## 2015-10-24 DIAGNOSIS — R072 Precordial pain: Secondary | ICD-10-CM

## 2015-10-24 MED ORDER — ATORVASTATIN CALCIUM 40 MG PO TABS: 40.0000 mg | ORAL_TABLET | Freq: Every day | ORAL | Status: DC

## 2015-10-24 NOTE — Patient Instructions (Signed)
Medication Instructions:   DECREASE ATORVASTATIN TO 40 MG ONCE DAILY= 1/2 OF THE 80 MG TABLET ONCE DAILY  Testing/Procedures:  Your physician has requested that you have a lexiscan myoview. For further information please visit [email or web address]. Please follow instruction sheet, as given.    Follow-Up:  Your physician recommends that you schedule a follow-up appointment in: [place]

## 2015-10-24 NOTE — Assessment & Plan Note (Addendum)
Continue PlavixAnd statin. Note she is having some leg discomfort. Decrease Lipitor to 40 mg daily to see if this improves.Otherwise may need to change to a different statin.

## 2015-10-24 NOTE — Assessment & Plan Note (Signed)
Continue ACE inhibitor and beta blocker. I will likely repeat herEchocardiogram when she returns in 3 months.

## 2015-10-24 NOTE — Assessment & Plan Note (Signed)
She is having vagueChest discomfort unlike her previous infarct pain. It is not exertional. Short-lived. Plan nuclear study for risk stratification. Her electrocardiogram shows improvement compared to previous.

## 2015-10-24 NOTE — Assessment & Plan Note (Signed)
Blood pressure controlled. Continue present medications. 

## 2015-10-24 NOTE — Assessment & Plan Note (Signed)
Continue beta blocker and apixaban. 

## 2015-11-02 ENCOUNTER — Ambulatory Visit (INDEPENDENT_AMBULATORY_CARE_PROVIDER_SITE_OTHER): Payer: Medicare HMO | Admitting: Emergency Medicine

## 2015-11-02 ENCOUNTER — Encounter: Payer: Self-pay | Admitting: Emergency Medicine

## 2015-11-02 VITALS — BP 110/74 | HR 68 | Ht 63.0 in | Wt 187.0 lb

## 2015-11-02 DIAGNOSIS — R911 Solitary pulmonary nodule: Secondary | ICD-10-CM | POA: Diagnosis not present

## 2015-11-02 DIAGNOSIS — R059 Cough, unspecified: Secondary | ICD-10-CM | POA: Insufficient documentation

## 2015-11-02 DIAGNOSIS — R05 Cough: Secondary | ICD-10-CM | POA: Diagnosis not present

## 2015-11-02 DIAGNOSIS — J4520 Mild intermittent asthma, uncomplicated: Secondary | ICD-10-CM

## 2015-11-02 LAB — PULMONARY FUNCTION TEST
DL/VA % PRED: 78 %
DL/VA: 3.66 ml/min/mmHg/L
DLCO COR: 14.72 ml/min/mmHg
DLCO UNC % PRED: 63 %
DLCO cor % pred: 64 %
DLCO unc: 14.49 ml/min/mmHg
FEF 25-75 PRE: 2.85 L/s
FEF 25-75 Post: 3.46 L/sec
FEF2575-%CHANGE-POST: 21 %
FEF2575-%PRED-POST: 224 %
FEF2575-%PRED-PRE: 185 %
FEV1-%Change-Post: 4 %
FEV1-%Pred-Post: 111 %
FEV1-%Pred-Pre: 106 %
FEV1-Post: 2.21 L
FEV1-Pre: 2.12 L
FEV1FVC-%CHANGE-POST: 1 %
FEV1FVC-%Pred-Pre: 113 %
FEV6-%CHANGE-POST: 3 %
FEV6-%Pred-Post: 101 %
FEV6-%Pred-Pre: 98 %
FEV6-PRE: 2.47 L
FEV6-Post: 2.55 L
FEV6FVC-%Change-Post: 0 %
FEV6FVC-%Pred-Post: 105 %
FEV6FVC-%Pred-Pre: 104 %
FVC-%Change-Post: 2 %
FVC-%Pred-Post: 96 %
FVC-%Pred-Pre: 93 %
FVC-Post: 2.55 L
FVC-Pre: 2.49 L
POST FEV1/FVC RATIO: 87 %
PRE FEV1/FVC RATIO: 85 %
Post FEV6/FVC ratio: 100 %
Pre FEV6/FVC Ratio: 99 %
RV % pred: 81 %
RV: 1.84 L
TLC % pred: 90 %
TLC: 4.42 L

## 2015-11-02 NOTE — Assessment & Plan Note (Signed)
Repeat CT scan of the chest in September

## 2015-11-02 NOTE — Progress Notes (Signed)
Subjective:    Patient ID: Heather Howard, female    DOB: July 01, 1940, 75 y.o.   MRN: [id number]  HPI 75 year old minimal former smoker with a history of hypertension, ? Asthma / COPD, atrial fibrillation and coronary artery disease. She was admitted to the hospital in mid February with a non-ST elevation MI.  A CT scan of her chest was performed at [facility] on 08/03/15 that showed slightly spiculated right upper lobe nodule as well as some right. Tracheal lymphadenopathy. She underwent left heart cath with a bare metal stent placed in a mid LAD lesion on 08/05/15. She was treated with aspirin and Plavix during the interval 30 days with plans to follow-up here to revisit her for a nodule and lymphadenopathy. In the interim a PET scan has been performed that I personally reviewed on 08/19/15.   Also she is on flovent bid, uses ventolin on a schedule. She misses it when she doesn't take it. Note she was just started on lisinopril. Never had spirometry to her knowledge.   ROV 11/02/15 -- patient follows up for history of a right upper lobe pulmonary nodule that was not hypermetabolic on PET scan from 08/19/15. She also has a history of coronary disease with stenting as well as an asthma-type picture with dyspnea and cough. She underwent PFT today that I have reviewed - show normal airflows, normal volumes, slightly decreased diffusion. She continues to cough, non-productive. She does notice relationship with GERD sx and also PND.   Review of Systems As per history of present illness     Objective:   Physical Exam Filed Vitals:   11/02/15 1419  BP: 110/74  Pulse: 68  Height: 5\' 3"  (1.6 m)  Weight: 187 lb (84.823 kg)  SpO2: 96%     08/19/15 --  COMPARISON: CT thorax 08/03/2015  FINDINGS: NECK  No hypermetabolic lymph nodes back and sphincter occur in the neck.  CHEST  Small RIGHT upper lobe pulmonary nodule measures 7 mm compared to 9 mm (image 68, series 4). This may relate to  differing technique. No associated metabolic activity.  Mild metabolic activity associated RIGHT lower paratracheal lymph node with SUV max of 2.4.  ABDOMEN/PELVIS  There is a calcified central mesenteric mass measuring 3.5 cm (image 133, series 4) without associated metabolic activity. No abnormal metabolic activity in the liver. The adrenal glands and pancreas spleen are normal. No hypermetabolic abdominal or pelvic lymph nodes.  No abnormal metabolic activity in the bowel. Post hysterectomy.  SKELETON  No FDG evidence skeletal metastasis.  IMPRESSION: 1. No significant metabolic activity associated with the RIGHT upper lobe nodule of concern. Recommend follow-up CT of the thorax without contrast in 1-3 months to demonstrate stability or resolution. 2. Mild metabolic activity of RIGHT lower paratracheal lymph node. Favor reactive node . 3. Calcified central mesenteric mass without metabolic activity. Findings could represent prior infection in the mesentery. Cannot exclude a carcinoid metastasis. No additional evidence of carcinoma primary or metastasis.      Assessment & Plan:  Asthma No evidence of asthma on her current pulmonary function testing. I believe we can stop her Flovent. I believe that her cough has been most related to postnasal drip. There may also be a contributor of her GERD. Also she was changed from valsartan and back to lisinopril which will potentiate cough. She will keep albuterol available to use in the event she has an asthmatic type response to upper risk for infection or certain exposures.  Lung nodule- RUL  Repeat CT scan of the chest in September  Cough I asked her to start taking her exam nasal spray every day instead of when necessary. We will stop her Flovent as mentioned in the asthma section.Marland Kitchen She will need to discuss the pros/cons of lisinopril with cardiology.   Baltazar Apo, MD, PhD 11/02/2015, 3:33 PM [facility] [phone] or if no answer [phone]

## 2015-11-02 NOTE — Progress Notes (Signed)
PFT done today. 

## 2015-11-02 NOTE — Assessment & Plan Note (Signed)
I asked her to start taking her exam nasal spray every day instead of when necessary. We will stop her Flovent as mentioned in the asthma section.Marland Kitchen She will need to discuss the pros/cons of lisinopril with cardiology.

## 2015-11-02 NOTE — Patient Instructions (Addendum)
We will stop flovent Keep albuterol available to use 2 puffs if needed for shortness of breath Discuss your lisinopril with Dr Stanford Breed - the benefits of this medications may outweight the side effect of sustained cough.  Please start taking your fluticasone every day We will repeat your CT scan in September as planned.  Follow with Dr Lamonte Sakai in September after your CT to review the results.

## 2015-11-02 NOTE — Assessment & Plan Note (Signed)
No evidence of asthma on her current pulmonary function testing. I believe we can stop her Flovent. I believe that her cough has been most related to postnasal drip. There may also be a contributor of her GERD. Also she was changed from valsartan and back to lisinopril which will potentiate cough. She will keep albuterol available to use in the event she has an asthmatic type response to upper risk for infection or certain exposures.

## 2015-11-03 ENCOUNTER — Telehealth (HOSPITAL_COMMUNITY): Payer: Self-pay

## 2015-11-03 NOTE — Telephone Encounter (Signed)
Encounter complete. 

## 2015-11-08 ENCOUNTER — Ambulatory Visit (HOSPITAL_COMMUNITY)
Admission: RE | Admit: 2015-11-08 | Discharge: 2015-11-08 | Disposition: A | Discharge status: Home / Self Care | Payer: Medicare HMO | Source: Ambulatory Visit | Attending: Cardiology | Admitting: Cardiology

## 2015-11-08 DIAGNOSIS — I252 Old myocardial infarction: Secondary | ICD-10-CM | POA: Insufficient documentation

## 2015-11-08 DIAGNOSIS — Z9861 Coronary angioplasty status: Secondary | ICD-10-CM | POA: Diagnosis not present

## 2015-11-08 DIAGNOSIS — Z8249 Family history of ischemic heart disease and other diseases of the circulatory system: Secondary | ICD-10-CM | POA: Insufficient documentation

## 2015-11-08 DIAGNOSIS — J449 Chronic obstructive pulmonary disease, unspecified: Secondary | ICD-10-CM | POA: Insufficient documentation

## 2015-11-08 DIAGNOSIS — Z6833 Body mass index (BMI) 33.0-33.9, adult: Secondary | ICD-10-CM | POA: Insufficient documentation

## 2015-11-08 DIAGNOSIS — I10 Essential (primary) hypertension: Secondary | ICD-10-CM | POA: Insufficient documentation

## 2015-11-08 DIAGNOSIS — R5383 Other fatigue: Secondary | ICD-10-CM | POA: Diagnosis not present

## 2015-11-08 DIAGNOSIS — Z87891 Personal history of nicotine dependence: Secondary | ICD-10-CM | POA: Diagnosis not present

## 2015-11-08 DIAGNOSIS — I2510 Atherosclerotic heart disease of native coronary artery without angina pectoris: Secondary | ICD-10-CM | POA: Diagnosis not present

## 2015-11-08 DIAGNOSIS — I4891 Unspecified atrial fibrillation: Secondary | ICD-10-CM | POA: Diagnosis not present

## 2015-11-08 DIAGNOSIS — E669 Obesity, unspecified: Secondary | ICD-10-CM | POA: Insufficient documentation

## 2015-11-08 LAB — MYOCARDIAL PERFUSION IMAGING
CHL CUP NUCLEAR SRS: 1
CHL CUP NUCLEAR SSS: 4
CHL CUP RESTING HR STRESS: 61 {beats}/min
CSEPPHR: 81 {beats}/min
LV dias vol: 104 mL (ref 46–106)
LVSYSVOL: 36 mL
SDS: 3
TID: 1.16

## 2015-11-08 MED ORDER — TECHNETIUM TC 99M TETROFOSMIN IV KIT
10.2000 | PACK | Freq: Once | INTRAVENOUS | Status: AC | PRN
  Administered 2015-11-08: 10.2 via INTRAVENOUS
  Filled 2015-11-08: qty 10

## 2015-11-08 MED ORDER — AMINOPHYLLINE 25 MG/ML IV SOLN
75.0000 mg | Freq: Once | INTRAVENOUS | Status: AC
  Administered 2015-11-08: 75 mg via INTRAVENOUS

## 2015-11-08 MED ORDER — REGADENOSON 0.4 MG/5ML IV SOLN
0.4000 mg | Freq: Once | INTRAVENOUS | Status: AC
  Administered 2015-11-08: 0.4 mg via INTRAVENOUS

## 2015-11-08 MED ORDER — TECHNETIUM TC 99M TETROFOSMIN IV KIT
33.0000 | PACK | Freq: Once | INTRAVENOUS | Status: AC | PRN
  Administered 2015-11-08: 33 via INTRAVENOUS
  Filled 2015-11-08: qty 33

## 2015-11-17 DIAGNOSIS — Z6833 Body mass index (BMI) 33.0-33.9, adult: Secondary | ICD-10-CM | POA: Diagnosis not present

## 2015-11-17 DIAGNOSIS — J019 Acute sinusitis, unspecified: Secondary | ICD-10-CM | POA: Diagnosis not present

## 2015-11-25 ENCOUNTER — Telehealth: Payer: Self-pay | Admitting: Cardiology

## 2015-11-25 NOTE — Telephone Encounter (Signed)
New message       Pt c/o medication issue:  1. Name of Medication: Clopidogrel  2. How are you currently taking this medication (dosage and times per day)? 75 mg po daily  3. Are you having a reaction (difficulty breathing--STAT)? No  4. What is your medication issue? The pt is concerned the medication may need to make changes on the medications, before the pt cause in for a refill   Pt c/o medication issue:  1. Name of Medication:  Lipitor  2. How are you currently taking this medication (dosage and times per day)? 40 mg po  3. Are you having a reaction (difficulty breathing--STAT)? No  4. What is your medication issue? The pt is concerned the Lipitor was reduced but still have symptoms of muscle cramping  The medications have been changed but the pt it stating she is still having cramping in her muscles wants to make sure there are no more changes to her medications. The pt states her hips hurts and general weakness the pt seems to think it is the medications all together.

## 2015-11-25 NOTE — Telephone Encounter (Signed)
Left msg to call.

## 2015-11-29 ENCOUNTER — Telehealth: Payer: Self-pay | Admitting: Cardiology

## 2015-11-29 NOTE — Telephone Encounter (Signed)
Spoke with pt, she reports no change in symptoms since reducing the lipitor to 40 mg daily. She is also c/o something else making her feel bad. She was given the okay to stop the lipitor for a vacation. Pt instructed to change the metoprolol to pm dosing from taking it in the morning. She will monitor her bp for the next 4 to 5 days and then let us know what it is running so if changes in bp medications need to be made. She will call abck if symptoms worsen. Pt agreed with this plan.

## 2015-11-29 NOTE — Telephone Encounter (Signed)
Mrs.Magloire  Is calling because she wants to speak to you about her medications . They are making her feel terrible .Marland Kitchen Please call   Thanks

## 2015-12-01 DIAGNOSIS — D1801 Hemangioma of skin and subcutaneous tissue: Secondary | ICD-10-CM | POA: Diagnosis not present

## 2015-12-01 DIAGNOSIS — L820 Inflamed seborrheic keratosis: Secondary | ICD-10-CM | POA: Diagnosis not present

## 2015-12-01 DIAGNOSIS — L299 Pruritus, unspecified: Secondary | ICD-10-CM | POA: Diagnosis not present

## 2016-01-09 ENCOUNTER — Encounter: Payer: Self-pay | Admitting: Cardiology

## 2016-01-19 NOTE — Progress Notes (Signed)
HPI: FU atrial fibrillation. Patient previously seen by her primary care physician and noted to be in atrial fibrillation. Echo 4/16 showed normal LV function, mild AI, biatrial enlargement, trace MR, mild TR. Holter 4/16 showed rate controlled atrial fibrillation. We are treating with rate control and anticoagulation. Patient admitted in February 2017 with non-ST elevation myocardial infarction. Cardiac catheterization revealed a 70% LAD and ejection fraction 25-35%. She was treated with a bare metal stent. Echocardiogram February 2017 showed ejection fraction 35-40%. Mild left atrial enlargement, mild aortic and mitral regurgitation. Note a chest CT revealed a right middle lobe mass. Patient was seen by pulmonary and they recommended follow-up CT scans. Nuclear study 5/17 showed EF 65 and normal perfusion. Since last seen, She denies dyspnea. She has occasional vague chest discomfort for several minutes at night but does not have exertional chest pain. She complains of bilateral lower extremity cramping.  Current Outpatient Prescriptions  Medication Sig Dispense Refill  . alendronate (FOSAMAX) 70 MG tablet Take 70 mg by mouth once a week. Take with a full glass of water on an empty stomach.    Marland Kitchen apixaban (ELIQUIS) 5 MG TABS tablet Take 1 tablet (5 mg total) by mouth 2 (two) times daily. 60 tablet 5  . atorvastatin (LIPITOR) 40 MG tablet Take 1 tablet (40 mg total) by mouth daily at 6 PM. 90 tablet 3  . benzonatate (TESSALON) 200 MG capsule Take 200 mg by mouth as needed for cough.    . clopidogrel (PLAVIX) 75 MG tablet TK 1 T PO  QD  11  . Esomeprazole Magnesium (NEXIUM PO) Take 1 capsule by mouth as needed (REFLUX).     Marland Kitchen FLOVENT HFA 110 MCG/ACT inhaler Place 2 puffs into the nose 2 (two) times daily.    . furosemide (LASIX) 20 MG tablet TK 1 T PO D PRF FLUID OR EDEMA  1  . hyoscyamine (ANASPAZ) 0.125 MG TBDP disintergrating tablet Place 0.125 mg under the tongue as needed (THROAT SPASMS).      Marland Kitchen lisinopril (PRINIVIL,ZESTRIL) 10 MG tablet Take 1 tablet (10 mg total) by mouth daily. 90 tablet 3  . methocarbamol (ROBAXIN) 750 MG tablet Take 750 mg by mouth as needed for muscle spasms.    . metoprolol succinate (TOPROL-XL) 50 MG 24 hr tablet Take 1 tablet (50 mg total) by mouth daily. 90 tablet 3  . montelukast (SINGULAIR) 10 MG tablet Take 1 tablet by mouth daily.    . nitroGLYCERIN (NITROSTAT) 0.4 MG SL tablet Place 0.4 mg under the tongue every 5 (five) minutes as needed for chest pain.    . VENTOLIN HFA 108 (90 BASE) MCG/ACT inhaler Inhale 2 puffs into the lungs 2 (two) times daily.      No current facility-administered medications for this visit.      Past Medical History:  Diagnosis Date  . Anemia   . Anticoagulation adequate    on Eliquis  . Asthma   . Atrial fibrillation, permanent ([place])   . COPD (chronic obstructive pulmonary disease) ([place])   . Depression   . Fibrocystic breast disease   . GERD (gastroesophageal reflux disease)   . Hyperlipidemia   . Hypertension   . IBS (irritable bowel syndrome)     Past Surgical History:  Procedure Laterality Date  . APPENDECTOMY    . CARDIAC CATHETERIZATION N/A 08/05/2015   Procedure: Left Heart Cath and Coronary Angiography;  Surgeon: Jettie Booze, MD;  Location: [facility];  Service:  Cardiovascular;  Laterality: N/A;  . CARDIAC CATHETERIZATION  08/05/2015   Procedure: Coronary Stent Intervention;  Surgeon: Jettie Booze, MD;  Location: [place];  Service: Cardiovascular;;  . CARDIAC CATHETERIZATION  08/05/2015   Procedure: Intravascular Pressure Wire/FFR Study;  Surgeon: Jettie Booze, MD;  Location: [facility];  Service: Cardiovascular;;  . CESAREAN SECTION    . CHOLECYSTECTOMY    . NECK SURGERY    . TOTAL KNEE ARTHROPLASTY Bilateral 2009    Social History   Social History  . Marital status: Married    Spouse name: N/A  . Number of children: 3  . Years of education:  N/A   Occupational History  . Not on file.   Social History Main Topics  . Smoking status: Former Smoker    Quit date: 09/20/1964  . Smokeless tobacco: Never Used  . Alcohol use 0.0 oz/week     Comment: Rare  . Drug use: No  . Sexual activity: Not on file   Other Topics Concern  . Not on file   Social History Narrative  . No narrative on file    Family History  Problem Relation Age of Onset  . Stroke Paternal Grandmother   . Diabetes Mellitus I Mother   . Heart disease Mother   . Diabetes Mother   . Hypertension Mother   . Thrombosis Father   . Heart attack Neg Hx     ROS: Leg cramping and dry throat but no fevers or chills, productive cough, hemoptysis, dysphasia, odynophagia, melena, hematochezia, dysuria, hematuria, rash, seizure activity, orthopnea, PND, pedal edema, claudication. Remaining systems are negative.  Physical Exam: Well-developed obese in no acute distress.  Skin is warm and dry.  HEENT is normal.  Neck is supple.  Chest is clear to auscultation with normal expansion.  Cardiovascular exam is irregular Abdominal exam nontender or distended. No masses palpated. Extremities show no edema. neuro grossly intact  ECG Atrial fibrillation at a rate of 65. Cannot [place] out prior anterior infarct.  A/P  1 Plan to continue apixaban. Check hemoglobin and renal function. She is now off of beta blockade in her rate is controlled.  2 hyperlipidemia-she is complaining of cramps in her legs. We will see if a different statin will improve her symptoms. Discontinue Lipitor. Begin Crestor 10 mg daily. Check lipids and liver in 4 weeks.  3 ischemic cardiomyopathy- Plan repeat echocardiogram when she returns for blood work in 4 weeks. Continue ACE inhibitor. She describes some dry throat but this preceded lisinopril. If it persists we will consider changing to an ARB. She is off of beta blockade as her heart rate is controlled on no medications.  4 coronary artery  disease-continue Plavix and statin. She is not on aspirin given need for anticoagulation.  5 hypertension-blood pressure controlled. Continue present medications.  6 chest pain-vague chest discomfort at times. Unlike her previous pain. Recent nuclear study negative.  Kirk Ruths, MD

## 2016-01-26 ENCOUNTER — Ambulatory Visit (INDEPENDENT_AMBULATORY_CARE_PROVIDER_SITE_OTHER): Payer: Medicare HMO | Admitting: Cardiology

## 2016-01-26 ENCOUNTER — Encounter: Payer: Self-pay | Admitting: Cardiology

## 2016-01-26 VITALS — BP 132/80 | HR 65 | Ht 63.0 in | Wt 191.0 lb

## 2016-01-26 DIAGNOSIS — I2583 Coronary atherosclerosis due to lipid rich plaque: Secondary | ICD-10-CM

## 2016-01-26 DIAGNOSIS — I482 Chronic atrial fibrillation: Secondary | ICD-10-CM | POA: Diagnosis not present

## 2016-01-26 DIAGNOSIS — I4821 Permanent atrial fibrillation: Secondary | ICD-10-CM

## 2016-01-26 DIAGNOSIS — E785 Hyperlipidemia, unspecified: Secondary | ICD-10-CM | POA: Diagnosis not present

## 2016-01-26 DIAGNOSIS — I255 Ischemic cardiomyopathy: Secondary | ICD-10-CM

## 2016-01-26 DIAGNOSIS — I10 Essential (primary) hypertension: Secondary | ICD-10-CM

## 2016-01-26 DIAGNOSIS — I2510 Atherosclerotic heart disease of native coronary artery without angina pectoris: Secondary | ICD-10-CM

## 2016-01-26 MED ORDER — ROSUVASTATIN CALCIUM 10 MG PO TABS: 10.0000 mg | ORAL_TABLET | Freq: Every day | ORAL | 6 refills | Status: DC

## 2016-01-26 NOTE — Patient Instructions (Signed)
Medications  STOP Atorvastatin  START Rosuvastatin 10 mg (1 tab) daily.   Lab work  Return for lab work in 4 weeks.   Procedures  Your physician has requested that you have an echocardiogram. Echocardiography is a painless test that uses sound waves to create images of your heart. It provides your doctor with information about the size and shape of your heart and how well your heart's chambers and valves are working. This procedure takes approximately one hour. There are no restrictions for this procedure. 4 weeks--same day you come in for lab work.   Follow-up  Your physician wants you to follow-up in: 6 months with Dr. Stanford Breed. You will receive a reminder letter in the mail two months in advance. If you don't receive a letter, please call our office to schedule the follow-up appointment.  If you need a refill on your cardiac medications before your next appointment, please call your pharmacy.

## 2016-01-31 DIAGNOSIS — Z86010 Personal history of colonic polyps: Secondary | ICD-10-CM | POA: Diagnosis not present

## 2016-02-03 DIAGNOSIS — I255 Ischemic cardiomyopathy: Secondary | ICD-10-CM | POA: Diagnosis not present

## 2016-02-03 DIAGNOSIS — R7303 Prediabetes: Secondary | ICD-10-CM | POA: Diagnosis not present

## 2016-02-03 DIAGNOSIS — E785 Hyperlipidemia, unspecified: Secondary | ICD-10-CM | POA: Diagnosis not present

## 2016-02-03 DIAGNOSIS — I213 ST elevation (STEMI) myocardial infarction of unspecified site: Secondary | ICD-10-CM | POA: Diagnosis not present

## 2016-02-03 DIAGNOSIS — M810 Age-related osteoporosis without current pathological fracture: Secondary | ICD-10-CM | POA: Diagnosis not present

## 2016-02-03 DIAGNOSIS — I10 Essential (primary) hypertension: Secondary | ICD-10-CM | POA: Diagnosis not present

## 2016-02-03 DIAGNOSIS — I4891 Unspecified atrial fibrillation: Secondary | ICD-10-CM | POA: Diagnosis not present

## 2016-02-05 DIAGNOSIS — M25512 Pain in left shoulder: Secondary | ICD-10-CM | POA: Diagnosis not present

## 2016-02-05 DIAGNOSIS — M25572 Pain in left ankle and joints of left foot: Secondary | ICD-10-CM | POA: Diagnosis not present

## 2016-02-05 DIAGNOSIS — S99912A Unspecified injury of left ankle, initial encounter: Secondary | ICD-10-CM | POA: Diagnosis not present

## 2016-02-05 DIAGNOSIS — S4992XA Unspecified injury of left shoulder and upper arm, initial encounter: Secondary | ICD-10-CM | POA: Diagnosis not present

## 2016-02-05 DIAGNOSIS — S79912A Unspecified injury of left hip, initial encounter: Secondary | ICD-10-CM | POA: Diagnosis not present

## 2016-02-05 DIAGNOSIS — M25562 Pain in left knee: Secondary | ICD-10-CM | POA: Diagnosis not present

## 2016-02-05 DIAGNOSIS — S92252A Displaced fracture of navicular [scaphoid] of left foot, initial encounter for closed fracture: Secondary | ICD-10-CM | POA: Diagnosis not present

## 2016-02-05 DIAGNOSIS — T148 Other injury of unspecified body region: Secondary | ICD-10-CM | POA: Diagnosis not present

## 2016-02-05 DIAGNOSIS — W010XXA Fall on same level from slipping, tripping and stumbling without subsequent striking against object, initial encounter: Secondary | ICD-10-CM | POA: Diagnosis not present

## 2016-02-05 DIAGNOSIS — M25552 Pain in left hip: Secondary | ICD-10-CM | POA: Diagnosis not present

## 2016-02-07 ENCOUNTER — Other Ambulatory Visit (HOSPITAL_COMMUNITY): Payer: Medicare HMO

## 2016-02-07 ENCOUNTER — Other Ambulatory Visit: Payer: Medicare HMO

## 2016-02-10 DIAGNOSIS — M79602 Pain in left arm: Secondary | ICD-10-CM | POA: Diagnosis not present

## 2016-02-10 DIAGNOSIS — S92255A Nondisplaced fracture of navicular [scaphoid] of left foot, initial encounter for closed fracture: Secondary | ICD-10-CM | POA: Diagnosis not present

## 2016-02-16 DIAGNOSIS — X58XXXA Exposure to other specified factors, initial encounter: Secondary | ICD-10-CM | POA: Diagnosis not present

## 2016-02-16 DIAGNOSIS — S42215A Unspecified nondisplaced fracture of surgical neck of left humerus, initial encounter for closed fracture: Secondary | ICD-10-CM | POA: Diagnosis not present

## 2016-02-16 DIAGNOSIS — S43402A Unspecified sprain of left shoulder joint, initial encounter: Secondary | ICD-10-CM | POA: Diagnosis not present

## 2016-02-16 DIAGNOSIS — M79602 Pain in left arm: Secondary | ICD-10-CM | POA: Diagnosis not present

## 2016-02-17 DIAGNOSIS — S42202A Unspecified fracture of upper end of left humerus, initial encounter for closed fracture: Secondary | ICD-10-CM | POA: Diagnosis not present

## 2016-02-21 ENCOUNTER — Inpatient Hospital Stay: Admission: RE | Admit: 2016-02-21 | Payer: Medicare HMO | Source: Ambulatory Visit

## 2016-02-23 ENCOUNTER — Ambulatory Visit (INDEPENDENT_AMBULATORY_CARE_PROVIDER_SITE_OTHER)
Admission: RE | Admit: 2016-02-23 | Discharge: 2016-02-23 | Disposition: A | Discharge status: Home / Self Care | Payer: Medicare HMO | Source: Ambulatory Visit | Attending: Emergency Medicine | Admitting: Emergency Medicine

## 2016-02-23 ENCOUNTER — Other Ambulatory Visit: Payer: Medicare HMO | Admitting: *Deleted

## 2016-02-23 ENCOUNTER — Other Ambulatory Visit: Payer: Self-pay

## 2016-02-23 ENCOUNTER — Ambulatory Visit (HOSPITAL_COMMUNITY): Payer: Medicare HMO | Attending: Cardiology

## 2016-02-23 ENCOUNTER — Other Ambulatory Visit: Payer: Self-pay | Admitting: *Deleted

## 2016-02-23 DIAGNOSIS — I2583 Coronary atherosclerosis due to lipid rich plaque: Secondary | ICD-10-CM

## 2016-02-23 DIAGNOSIS — R079 Chest pain, unspecified: Secondary | ICD-10-CM | POA: Insufficient documentation

## 2016-02-23 DIAGNOSIS — I351 Nonrheumatic aortic (valve) insufficiency: Secondary | ICD-10-CM | POA: Diagnosis not present

## 2016-02-23 DIAGNOSIS — I429 Cardiomyopathy, unspecified: Secondary | ICD-10-CM | POA: Diagnosis present

## 2016-02-23 DIAGNOSIS — I2510 Atherosclerotic heart disease of native coronary artery without angina pectoris: Secondary | ICD-10-CM

## 2016-02-23 DIAGNOSIS — I4891 Unspecified atrial fibrillation: Secondary | ICD-10-CM | POA: Diagnosis not present

## 2016-02-23 DIAGNOSIS — I255 Ischemic cardiomyopathy: Secondary | ICD-10-CM | POA: Insufficient documentation

## 2016-02-23 DIAGNOSIS — D649 Anemia, unspecified: Secondary | ICD-10-CM | POA: Insufficient documentation

## 2016-02-23 DIAGNOSIS — I340 Nonrheumatic mitral (valve) insufficiency: Secondary | ICD-10-CM | POA: Insufficient documentation

## 2016-02-23 DIAGNOSIS — I10 Essential (primary) hypertension: Secondary | ICD-10-CM

## 2016-02-23 DIAGNOSIS — I272 Other secondary pulmonary hypertension: Secondary | ICD-10-CM | POA: Diagnosis not present

## 2016-02-23 DIAGNOSIS — I119 Hypertensive heart disease without heart failure: Secondary | ICD-10-CM | POA: Diagnosis not present

## 2016-02-23 DIAGNOSIS — I4821 Permanent atrial fibrillation: Secondary | ICD-10-CM

## 2016-02-23 DIAGNOSIS — I252 Old myocardial infarction: Secondary | ICD-10-CM | POA: Insufficient documentation

## 2016-02-23 DIAGNOSIS — R911 Solitary pulmonary nodule: Secondary | ICD-10-CM

## 2016-02-23 DIAGNOSIS — I480 Paroxysmal atrial fibrillation: Secondary | ICD-10-CM | POA: Diagnosis not present

## 2016-02-23 LAB — HEPATIC FUNCTION PANEL
ALK PHOS: 160 U/L — AB (ref 33–130)
ALT: 8 U/L (ref 6–29)
AST: 14 U/L (ref 10–35)
Albumin: 3.4 g/dL — ABNORMAL LOW (ref 3.6–5.1)
BILIRUBIN DIRECT: 0.2 mg/dL (ref <=0.2)
BILIRUBIN TOTAL: 0.8 mg/dL (ref 0.2–1.2)
Indirect Bilirubin: 0.6 mg/dL (ref 0.2–1.2)
Total Protein: 6.1 g/dL (ref 6.1–8.1)

## 2016-02-23 LAB — LIPID PANEL
CHOLESTEROL: 104 mg/dL — AB (ref 125–200)
HDL: 55 mg/dL (ref >=46)
LDL Cholesterol: 37 mg/dL (ref <130)
Total CHOL/HDL Ratio: 1.9 Ratio (ref <=5.0)
Triglycerides: 62 mg/dL (ref <150)
VLDL: 12 mg/dL (ref <30)

## 2016-02-23 NOTE — Addendum Note (Signed)
Addended by: Eulis Foster on: 02/23/2016 09:34 AM   Modules accepted: Orders

## 2016-02-23 NOTE — Progress Notes (Signed)
New orders placed for labs as requested by lab tech, Lanny Hurst.

## 2016-02-27 ENCOUNTER — Other Ambulatory Visit: Payer: Self-pay | Admitting: *Deleted

## 2016-02-27 ENCOUNTER — Telehealth: Payer: Self-pay

## 2016-02-27 DIAGNOSIS — I2510 Atherosclerotic heart disease of native coronary artery without angina pectoris: Secondary | ICD-10-CM

## 2016-02-27 DIAGNOSIS — I2583 Coronary atherosclerosis due to lipid rich plaque: Secondary | ICD-10-CM

## 2016-02-27 DIAGNOSIS — Z79899 Other long term (current) drug therapy: Secondary | ICD-10-CM

## 2016-02-27 NOTE — Telephone Encounter (Signed)
Agree Heather Howard  

## 2016-02-27 NOTE — Telephone Encounter (Signed)
Spoke to patient recent echo results given.She stated she wanted Dr.Crenshaw to know she fell recently fractured left foot and left arm.Stated she noticed she has chest discomfort when she is having pain in foot and arm right before time to take pain medication.Only has chest discomfort when having foot and arm pain.Advised if chest discomfort continues to call back.Advised sounds like related to pain.Advised I will send message to [facility] for review.

## 2016-02-28 ENCOUNTER — Other Ambulatory Visit: Payer: Self-pay | Admitting: *Deleted

## 2016-02-28 ENCOUNTER — Other Ambulatory Visit: Payer: Self-pay | Admitting: Cardiology

## 2016-02-28 DIAGNOSIS — I2510 Atherosclerotic heart disease of native coronary artery without angina pectoris: Secondary | ICD-10-CM

## 2016-02-28 DIAGNOSIS — I2583 Coronary atherosclerosis due to lipid rich plaque: Principal | ICD-10-CM

## 2016-02-28 MED ORDER — ROSUVASTATIN CALCIUM 5 MG PO TABS: 5.0000 mg | ORAL_TABLET | Freq: Every day | ORAL | 6 refills | Status: DC

## 2016-02-29 NOTE — Telephone Encounter (Signed)
Spoke to patient Dr.Crenshaw agrees if she continues to have chest pain to call back.Stated she has not had any chest discomfort in 2 days.Stated she continues to have pain in left foot and shoulder.Advised I hope she feels better soon.Advised to keep appointment with Dr.Crenshaw as planned and call sooner if needed.

## 2016-03-01 ENCOUNTER — Telehealth: Payer: Self-pay | Admitting: Cardiology

## 2016-03-01 DIAGNOSIS — J019 Acute sinusitis, unspecified: Secondary | ICD-10-CM | POA: Diagnosis not present

## 2016-03-01 DIAGNOSIS — Z6832 Body mass index (BMI) 32.0-32.9, adult: Secondary | ICD-10-CM | POA: Diagnosis not present

## 2016-03-01 NOTE — Telephone Encounter (Signed)
Spoke with patient and she started having shortness of breath yesterday.  Denies any swelling or chest pain.  Did have a fall 02/05/16 and broke her arm and foot. Trying to be a mobile as possible given her foot situation.  Has not missed any doses of Eliquis  C/O productive cough with green sputum and chills Advised patient to contact her PCP, verbalized understanding

## 2016-03-01 NOTE — Telephone Encounter (Signed)
New message    Pt calling about her breathing.   Pt c/o Shortness Of Breath: STAT if SOB developed within the last 24 hours or pt is noticeably SOB on the phone  1. Are you currently SOB (can you hear that pt is SOB on the phone)? yes  2. How long have you been experiencing SOB? Yesterday and today  3. Are you SOB when sitting or when up moving around? Sitting or laying down  4. Are you currently experiencing any other symptoms? Sweating

## 2016-03-02 DIAGNOSIS — S42202A Unspecified fracture of upper end of left humerus, initial encounter for closed fracture: Secondary | ICD-10-CM | POA: Diagnosis not present

## 2016-03-02 DIAGNOSIS — Z780 Asymptomatic menopausal state: Secondary | ICD-10-CM | POA: Diagnosis not present

## 2016-03-02 DIAGNOSIS — M810 Age-related osteoporosis without current pathological fracture: Secondary | ICD-10-CM | POA: Diagnosis not present

## 2016-03-02 DIAGNOSIS — S92255A Nondisplaced fracture of navicular [scaphoid] of left foot, initial encounter for closed fracture: Secondary | ICD-10-CM | POA: Diagnosis not present

## 2016-03-13 ENCOUNTER — Encounter: Payer: Self-pay | Admitting: Emergency Medicine

## 2016-03-13 ENCOUNTER — Ambulatory Visit (INDEPENDENT_AMBULATORY_CARE_PROVIDER_SITE_OTHER): Payer: Medicare HMO | Admitting: Emergency Medicine

## 2016-03-13 VITALS — BP 132/82 | HR 76 | Ht 63.0 in | Wt 189.2 lb

## 2016-03-13 DIAGNOSIS — J4520 Mild intermittent asthma, uncomplicated: Secondary | ICD-10-CM

## 2016-03-13 DIAGNOSIS — R911 Solitary pulmonary nodule: Secondary | ICD-10-CM | POA: Diagnosis not present

## 2016-03-13 DIAGNOSIS — R059 Cough, unspecified: Secondary | ICD-10-CM

## 2016-03-13 DIAGNOSIS — R05 Cough: Secondary | ICD-10-CM

## 2016-03-13 NOTE — Progress Notes (Signed)
Subjective:    Patient ID: Heather Howard, female    DOB: [birth year], 75 y.o.   MRN: [id number]  HPI 75 year old minimal former smoker with a history of hypertension, ? Asthma / COPD, atrial fibrillation and coronary artery disease. She was admitted to the hospital in mid February with a non-ST elevation MI.  A CT scan of her chest was performed at [facility] on 08/03/15 that showed slightly spiculated right upper lobe nodule as well as some right. Tracheal lymphadenopathy. She underwent left heart cath with a bare metal stent placed in a mid LAD lesion on 08/05/15. She was treated with aspirin and Plavix during the interval 30 days with plans to follow-up here to revisit her for a nodule and lymphadenopathy. In the interim a PET scan has been performed that I personally reviewed on 08/19/15.   Also she is on flovent bid, uses ventolin on a schedule. She misses it when she doesn't take it. Note she was just started on lisinopril. Never had spirometry to her knowledge.   ROV 11/02/15 -- patient follows up for history of a right upper lobe pulmonary nodule that was not hypermetabolic on PET scan from 08/19/15. She also has a history of coronary disease with stenting as well as an asthma-type picture with dyspnea and cough. She underwent PFT today that I have reviewed - show normal airflows, normal volumes, slightly decreased diffusion. She continues to cough, non-productive. She does notice relationship with GERD sx and also PND.   03/13/16 --  Follow-up visit for possible history of asthma although recent primary function testing has been reassuring. Also right upper lobe pulmonary nodule, evaluation to date has included a reassuring PET scan 08/19/15. We repeated her CT scan of the chest 02/23/16 and I have personally reviewed. This shows that her 7 mm right middle lobe nodule is unchanged in size. Smaller 2-3 mm nodules are also stable. Last visit we stopped her Flovent, she has tolerated. Cough better. She was  treated for a pharyngitis / bronchitis in setting of reflux. Uses ventolin about 2x a day.  She remains on lisinopril.    Review of Systems As per history of present illness     Objective:   Physical Exam Vitals:   03/13/16 1024 03/13/16 1025  BP: 132/82 132/82  Pulse: 76 76  SpO2: 96% 96%  Weight: 189 lb 3.2 oz (85.8 kg)   Height: 5\' 3"  (1.6 m)      08/19/15 --  COMPARISON: CT thorax 08/03/2015  FINDINGS: NECK  No hypermetabolic lymph nodes back and sphincter occur in the neck.  CHEST  Small RIGHT upper lobe pulmonary nodule measures 7 mm compared to 9 mm (image 68, series 4). This may relate to differing technique. No associated metabolic activity.  Mild metabolic activity associated RIGHT lower paratracheal lymph node with SUV max of 2.4.  ABDOMEN/PELVIS  There is a calcified central mesenteric mass measuring 3.5 cm (image 133, series 4) without associated metabolic activity. No abnormal metabolic activity in the liver. The adrenal glands and pancreas spleen are normal. No hypermetabolic abdominal or pelvic lymph nodes.  No abnormal metabolic activity in the bowel. Post hysterectomy.  SKELETON  No FDG evidence skeletal metastasis.  IMPRESSION: 1. No significant metabolic activity associated with the RIGHT upper lobe nodule of concern. Recommend follow-up CT of the thorax without contrast in 1-3 months to demonstrate stability or resolution. 2. Mild metabolic activity of RIGHT lower paratracheal lymph node. Favor reactive node . 3. Calcified central mesenteric mass  without metabolic activity. Findings could represent prior infection in the mesentery. Cannot exclude a carcinoid metastasis. No additional evidence of carcinoma primary or metastasis.  CT chest 02/23/16 COMPARISON:  PET of 08/19/2015.  CT of the 08/03/2015.  FINDINGS: Cardiovascular: Aortic and branch vessel atherosclerosis. Moderate cardiomegaly with LAD and left circumflex  coronary artery atherosclerosis. No pericardial effusion. Pulmonary artery enlargement, including a 3.4 cm outflow tract.  Mediastinum/Nodes: 11 mm right paratracheal node on image 20/series 2 is similar to minimally decreased from 12 mm on the prior CT.  Hilar regions poorly evaluated without intravenous contrast. A small to moderate hiatal hernia.  Lungs/Pleura: No pleural fluid.  A right upper lobe pulmonary nodule measures 3 mm on image 22/series 3. Not readily apparent on the prior exam, possibly due to differences in slice selection.  Right middle lobe pulmonary nodule measures 6 x 7 mm on image 31/series 3. Compare similar at 7 x 6 mm on the prior exam (when remeasured). 7 mm craniocaudal on sagittal image 29 today versus similar on the prior exam (when remeasured).  Minimal nodularity along the posterior right upper lobe, including on images 36 and 37/ series 3. Likely similar.  A right lower lobe pulmonary nodule measures 4 mm on image 29/series 3 and is unchanged.  2 mm posterior right upper lobe pulmonary nodule on image 18/series 3 is similar.  Subpleural 2 mm left lower lobe pulmonary nodule on image 33/ series 3 is unchanged.  Upper Abdomen: Cholecystectomy. Normal imaged portions of the liver, spleen, pancreas, adrenal glands, left kidney. Upper pole right renal lesion measures 3.8 cm and fluid density, likely a cyst. Abdominal aortic atherosclerosis.  Musculoskeletal: No acute osseous abnormality.  IMPRESSION: 1. The dominant nodule in the right middle lobe is similar in size, maximally 7 mm. If the patient is at high risk for primary bronchogenic carcinoma, followup at 12-18 months is recommended. If not, follow-up at 12-18 months should be considered. This recommendation follows the consensus statement: Guidelines for Management of Small Pulmonary Nodules Detected on CT Images: From the Fleischner Society 2017; published online before  print (10.1148/radiol.IJ:2314499). 2. Other pulmonary nodules are primarily similar and warrant followup attention. 3. Mild right paratracheal adenopathy is similar to slightly improved. 4.  Coronary artery atherosclerosis. Aortic atherosclerosis. 5. Pulmonary artery enlargement suggests pulmonary arterial hypertension      Assessment & Plan:  Lung nodule- RUL Stable in size on most recent CT scan of the chest. We'll plan to repeat her CT scan in September 2018 as long as there is no interval clinical change.  Asthma She tolerated discontinuation of Flovent, in fact her cough probably benefited. She does still have some flares of cough and had a recent pharyngitis/bronchitis in the setting of increased GERD. She is using Ventolin about twice a day.   Cough Benefit from discontinuation of Flovent. Continue lisinopril for now although at some point depending on symptoms we may need to consider a change.  Baltazar Apo, MD, PhD 03/13/2016, 10:49 AM [facility] [phone] or if no answer [phone]

## 2016-03-13 NOTE — Assessment & Plan Note (Signed)
She tolerated discontinuation of Flovent, in fact her cough probably benefited. She does still have some flares of cough and had a recent pharyngitis/bronchitis in the setting of increased GERD. She is using Ventolin about twice a day.

## 2016-03-13 NOTE — Assessment & Plan Note (Signed)
Stable in size on most recent CT scan of the chest. We'll plan to repeat her CT scan in September 2018 as long as there is no interval clinical change.

## 2016-03-13 NOTE — Patient Instructions (Signed)
We will continue your current meds as you are taking them We will your CT scan in September 2018 without contrast Take albuterol 2 puffs up to every 4 hours if needed for shortness of breath.  We will not change your lisinopril at this time.  Follow with Dr Lamonte Sakai in 12 months or sooner if needed

## 2016-03-13 NOTE — Assessment & Plan Note (Signed)
Benefit from discontinuation of Flovent. Continue lisinopril for now although at some point depending on symptoms we may need to consider a change.

## 2016-03-15 ENCOUNTER — Telehealth: Payer: Self-pay | Admitting: Cardiology

## 2016-03-15 NOTE — Telephone Encounter (Signed)
New message   Pt verbalized that she is calling for the rn to see why she has to schedule the labs. pt said sept 7th said she already had labs done, do she need to have more

## 2016-03-15 NOTE — Telephone Encounter (Signed)
Spoke with pt, questions regarding lab work answered. °

## 2016-03-16 DIAGNOSIS — S42202A Unspecified fracture of upper end of left humerus, initial encounter for closed fracture: Secondary | ICD-10-CM | POA: Diagnosis not present

## 2016-03-16 DIAGNOSIS — S92255A Nondisplaced fracture of navicular [scaphoid] of left foot, initial encounter for closed fracture: Secondary | ICD-10-CM | POA: Diagnosis not present

## 2016-03-19 DIAGNOSIS — Z780 Asymptomatic menopausal state: Secondary | ICD-10-CM | POA: Diagnosis not present

## 2016-03-19 DIAGNOSIS — M810 Age-related osteoporosis without current pathological fracture: Secondary | ICD-10-CM | POA: Diagnosis not present

## 2016-03-22 DIAGNOSIS — M25512 Pain in left shoulder: Secondary | ICD-10-CM | POA: Diagnosis not present

## 2016-03-22 DIAGNOSIS — M6281 Muscle weakness (generalized): Secondary | ICD-10-CM | POA: Diagnosis not present

## 2016-04-10 DIAGNOSIS — I2510 Atherosclerotic heart disease of native coronary artery without angina pectoris: Secondary | ICD-10-CM | POA: Diagnosis not present

## 2016-04-10 DIAGNOSIS — I482 Chronic atrial fibrillation: Secondary | ICD-10-CM | POA: Diagnosis not present

## 2016-04-10 LAB — CBC
HCT: 33.5 % — ABNORMAL LOW (ref 35.0–45.0)
Hemoglobin: 10.4 g/dL — ABNORMAL LOW (ref 11.7–15.5)
MCH: 27.9 pg (ref 27.0–33.0)
MCHC: 31 g/dL — ABNORMAL LOW (ref 32.0–36.0)
MCV: 89.8 fL (ref 80.0–100.0)
MPV: 8.6 fL (ref 7.5–12.5)
PLATELETS: 400 10*3/uL (ref 140–400)
RBC: 3.73 MIL/uL — AB (ref 3.80–5.10)
RDW: 14.6 % (ref 11.0–15.0)
WBC: 6.4 10*3/uL (ref 3.8–10.8)

## 2016-04-11 ENCOUNTER — Encounter: Payer: Self-pay | Admitting: *Deleted

## 2016-04-11 LAB — LIPID PANEL
Cholesterol: 114 mg/dL — ABNORMAL LOW (ref 125–200)
HDL: 45 mg/dL — AB (ref >=46)
LDL CALC: 56 mg/dL (ref <130)
TRIGLYCERIDES: 67 mg/dL (ref <150)
Total CHOL/HDL Ratio: 2.5 Ratio (ref <=5.0)
VLDL: 13 mg/dL (ref <30)

## 2016-04-11 LAB — BASIC METABOLIC PANEL WITH GFR
BUN: 10 mg/dL (ref 7–25)
CHLORIDE: 102 mmol/L (ref 98–110)
CO2: 26 mmol/L (ref 20–31)
CREATININE: 0.75 mg/dL (ref 0.60–0.93)
Calcium: 9.3 mg/dL (ref 8.6–10.4)
GFR, Est Non African American: 78 mL/min (ref >=60)
Glucose, Bld: 93 mg/dL (ref 65–99)
Potassium: 4.1 mmol/L (ref 3.5–5.3)
SODIUM: 139 mmol/L (ref 135–146)

## 2016-04-11 LAB — HEPATIC FUNCTION PANEL
ALBUMIN: 3.6 g/dL (ref 3.6–5.1)
ALK PHOS: 146 U/L — AB (ref 33–130)
ALT: 8 U/L (ref 6–29)
AST: 16 U/L (ref 10–35)
Bilirubin, Direct: 0.1 mg/dL (ref <=0.2)
Indirect Bilirubin: 0.4 mg/dL (ref 0.2–1.2)
TOTAL PROTEIN: 6.2 g/dL (ref 6.1–8.1)
Total Bilirubin: 0.5 mg/dL (ref 0.2–1.2)

## 2016-04-13 ENCOUNTER — Other Ambulatory Visit: Payer: Self-pay | Admitting: Cardiology

## 2016-04-13 DIAGNOSIS — S92255A Nondisplaced fracture of navicular [scaphoid] of left foot, initial encounter for closed fracture: Secondary | ICD-10-CM | POA: Diagnosis not present

## 2016-04-13 DIAGNOSIS — S42202A Unspecified fracture of upper end of left humerus, initial encounter for closed fracture: Secondary | ICD-10-CM | POA: Diagnosis not present

## 2016-04-17 DIAGNOSIS — M6281 Muscle weakness (generalized): Secondary | ICD-10-CM | POA: Diagnosis not present

## 2016-04-17 DIAGNOSIS — M25512 Pain in left shoulder: Secondary | ICD-10-CM | POA: Diagnosis not present

## 2016-04-18 DIAGNOSIS — Z23 Encounter for immunization: Secondary | ICD-10-CM | POA: Diagnosis not present

## 2016-04-24 DIAGNOSIS — M25512 Pain in left shoulder: Secondary | ICD-10-CM | POA: Diagnosis not present

## 2016-04-24 DIAGNOSIS — M6281 Muscle weakness (generalized): Secondary | ICD-10-CM | POA: Diagnosis not present

## 2016-05-01 DIAGNOSIS — M6281 Muscle weakness (generalized): Secondary | ICD-10-CM | POA: Diagnosis not present

## 2016-05-01 DIAGNOSIS — M25512 Pain in left shoulder: Secondary | ICD-10-CM | POA: Diagnosis not present

## 2016-05-08 DIAGNOSIS — M6281 Muscle weakness (generalized): Secondary | ICD-10-CM | POA: Diagnosis not present

## 2016-05-08 DIAGNOSIS — M25512 Pain in left shoulder: Secondary | ICD-10-CM | POA: Diagnosis not present

## 2016-05-15 DIAGNOSIS — M25512 Pain in left shoulder: Secondary | ICD-10-CM | POA: Diagnosis not present

## 2016-05-15 DIAGNOSIS — M6281 Muscle weakness (generalized): Secondary | ICD-10-CM | POA: Diagnosis not present

## 2016-05-29 DIAGNOSIS — M6281 Muscle weakness (generalized): Secondary | ICD-10-CM | POA: Diagnosis not present

## 2016-05-29 DIAGNOSIS — M25512 Pain in left shoulder: Secondary | ICD-10-CM | POA: Diagnosis not present

## 2016-06-05 DIAGNOSIS — I482 Chronic atrial fibrillation: Secondary | ICD-10-CM | POA: Diagnosis not present

## 2016-06-05 DIAGNOSIS — K210 Gastro-esophageal reflux disease with esophagitis: Secondary | ICD-10-CM | POA: Diagnosis not present

## 2016-06-05 DIAGNOSIS — I2510 Atherosclerotic heart disease of native coronary artery without angina pectoris: Secondary | ICD-10-CM | POA: Diagnosis not present

## 2016-06-05 DIAGNOSIS — I10 Essential (primary) hypertension: Secondary | ICD-10-CM | POA: Diagnosis not present

## 2016-06-05 DIAGNOSIS — R42 Dizziness and giddiness: Secondary | ICD-10-CM | POA: Diagnosis not present

## 2016-06-05 DIAGNOSIS — R739 Hyperglycemia, unspecified: Secondary | ICD-10-CM | POA: Diagnosis not present

## 2016-06-05 DIAGNOSIS — R7303 Prediabetes: Secondary | ICD-10-CM | POA: Diagnosis not present

## 2016-06-05 DIAGNOSIS — E782 Mixed hyperlipidemia: Secondary | ICD-10-CM | POA: Diagnosis not present

## 2016-07-02 ENCOUNTER — Telehealth: Payer: Self-pay | Admitting: Cardiology

## 2016-07-02 DIAGNOSIS — I2583 Coronary atherosclerosis due to lipid rich plaque: Principal | ICD-10-CM

## 2016-07-02 DIAGNOSIS — I2510 Atherosclerotic heart disease of native coronary artery without angina pectoris: Secondary | ICD-10-CM

## 2016-07-02 MED ORDER — ROSUVASTATIN CALCIUM 5 MG PO TABS: 5.0000 mg | ORAL_TABLET | Freq: Every day | ORAL | 7 refills | Status: DC

## 2016-07-02 NOTE — Telephone Encounter (Signed)
New Message   *STAT* If patient is at the pharmacy, call can be transferred to refill team.   1. Which medications need to be refilled? (please list name of each medication and dose if known) rosuvastatin   2. Which pharmacy/location (including street and city if local pharmacy) is medication to be sent to? [facility] 

## 2016-07-02 NOTE — Telephone Encounter (Signed)
Rx(s) sent to pharmacy electronically.  

## 2016-07-03 ENCOUNTER — Other Ambulatory Visit: Payer: Self-pay | Admitting: Cardiovascular Disease

## 2016-07-03 DIAGNOSIS — I2583 Coronary atherosclerosis due to lipid rich plaque: Principal | ICD-10-CM

## 2016-07-03 DIAGNOSIS — I2510 Atherosclerotic heart disease of native coronary artery without angina pectoris: Secondary | ICD-10-CM

## 2016-07-03 MED ORDER — ROSUVASTATIN CALCIUM 5 MG PO TABS: 5.0000 mg | ORAL_TABLET | Freq: Every day | ORAL | 7 refills | Status: AC

## 2016-07-03 NOTE — Telephone Encounter (Signed)
Rx(s) sent to pharmacy electronically.  

## 2016-07-03 NOTE — Telephone Encounter (Signed)
°*  STAT* If patient is at the pharmacy, call can be transferred to refill team.   1. Which medications need to be refilled? (please list name of each medication and dose if known)Rosuvastatin Calcium 5 mg   2. Which pharmacy/location (including street and city if local pharmacy) is medication to be sent to?[facility] in [place]  3. Do they need a 30 day or 90 day supply? [facility]

## 2016-07-20 NOTE — Progress Notes (Signed)
HPI: FU atrial fibrillation. Patient previously seen by her primary care physician and noted to be in atrial fibrillation. Holter 4/16 showed rate controlled atrial fibrillation. We are treating with rate control and anticoagulation. Patient admitted in February 2017 with non-ST elevation myocardial infarction. Cardiac catheterization revealed a 70% LAD and ejection fraction 25-35%. She was treated with a bare metal stent. Nuclear study 5/17 showed EF 65 and normal perfusion. Echocardiogram repeated September 2017 showed normal LV function, mild right ventricular enlargement and mild right atrial enlargement. Since last seen, she denies dyspnea, palpitations or syncope. She occasionally feels indigestion but does not have exertional chest pain.  Current Outpatient Prescriptions  Medication Sig Dispense Refill  . alendronate (FOSAMAX) 70 MG tablet Take 70 mg by mouth once a week. Take with a full glass of water on an empty stomach.    Marland Kitchen apixaban (ELIQUIS) 5 MG TABS tablet Take 1 tablet (5 mg total) by mouth 2 (two) times daily. 60 tablet 5  . clopidogrel (PLAVIX) 75 MG tablet TAKE 1 TABLET BY MOUTH DAILY 90 tablet 3  . Esomeprazole Magnesium (NEXIUM PO) Take 1 capsule by mouth as needed (REFLUX).     . fexofenadine (ALLEGRA) 180 MG tablet Take 180 mg by mouth daily.    . furosemide (LASIX) 20 MG tablet TK 1 T PO D PRF FLUID OR EDEMA  1  . hyoscyamine (ANASPAZ) 0.125 MG TBDP disintergrating tablet Place 0.125 mg under the tongue as needed (THROAT SPASMS).     Marland Kitchen lisinopril (PRINIVIL,ZESTRIL) 10 MG tablet Take 1 tablet (10 mg total) by mouth daily. 90 tablet 3  . methocarbamol (ROBAXIN) 750 MG tablet Take 750 mg by mouth as needed for muscle spasms.    . montelukast (SINGULAIR) 10 MG tablet Take 1 tablet by mouth daily.    . nitroGLYCERIN (NITROSTAT) 0.4 MG SL tablet Place 0.4 mg under the tongue every 5 (five) minutes as needed for chest pain.    . rosuvastatin (CRESTOR) 5 MG tablet Take 1  tablet (5 mg total) by mouth daily. 30 tablet 7  . VENTOLIN HFA 108 (90 BASE) MCG/ACT inhaler Inhale 2 puffs into the lungs 2 (two) times daily.      No current facility-administered medications for this visit.      Past Medical History:  Diagnosis Date  . Anemia   . Anticoagulation adequate    on Eliquis  . Asthma   . Atrial fibrillation, permanent ([place])   . COPD (chronic obstructive pulmonary disease) ([place])   . Depression   . Fibrocystic breast disease   . GERD (gastroesophageal reflux disease)   . Hyperlipidemia   . Hypertension   . IBS (irritable bowel syndrome)     Past Surgical History:  Procedure Laterality Date  . APPENDECTOMY    . CARDIAC CATHETERIZATION N/A 08/05/2015   Procedure: Left Heart Cath and Coronary Angiography;  Surgeon: Jettie Booze, MD;  Location: [facility];  Service: Cardiovascular;  Laterality: N/A;  . CARDIAC CATHETERIZATION  08/05/2015   Procedure: Coronary Stent Intervention;  Surgeon: Jettie Booze, MD;  Location: [facility];  Service: Cardiovascular;;  . CARDIAC CATHETERIZATION  08/05/2015   Procedure: Intravascular Pressure Wire/FFR Study;  Surgeon: Jettie Booze, MD;  Location: [facility];  Service: Cardiovascular;;  . CESAREAN SECTION    . CHOLECYSTECTOMY    . NECK SURGERY    . TOTAL KNEE ARTHROPLASTY Bilateral 2009    Social History   Social  History  . Marital status: Married    Spouse name: N/A  . Number of children: 3  . Years of education: N/A   Occupational History  . Not on file.   Social History Main Topics  . Smoking status: Former Smoker    Quit date: 09/20/1964  . Smokeless tobacco: Never Used  . Alcohol use 0.0 oz/week     Comment: Rare  . Drug use: No  . Sexual activity: Not on file   Other Topics Concern  . Not on file   Social History Narrative  . No narrative on file    Family History  Problem Relation Age of Onset  . Stroke Paternal Grandmother   . Diabetes  Mellitus I Mother   . Heart disease Mother   . Diabetes Mother   . Hypertension Mother   . Thrombosis Father   . Heart attack Neg Hx     ROS: no fevers or chills, productive cough, hemoptysis, dysphasia, odynophagia, melena, hematochezia, dysuria, hematuria, rash, seizure activity, orthopnea, PND, pedal edema, claudication. Remaining systems are negative.  Physical Exam: Well-developed well-nourished in no acute distress.  Skin is warm and dry.  HEENT is normal.  Neck is supple.  Chest is clear to auscultation with normal expansion.  Cardiovascular exam is irregular Abdominal exam nontender or distended. No masses palpated. Extremities show no edema. neuro grossly intact  ECG-Atrial fibrillation at a rate of 62. Nonspecific ST changes.  A/P  1 Permanent atrial fibrillation-patient's heart rate is controlled on no medications. Continue apixaban. Check Hgb and renal function.  2 hyperlipidemia-continue Crestor 10 mg daily. Higher doses caused myalgias previously.  3 hypertension-blood pressure mildly elevated but typically controlled at home. Continue present medications.  4 coronary artery disease-continue Plavix and statin.  5 ischemic cardiomyopathy-follow-up echocardiogram showed normalization of LV function. Continue ACE inhibitor and beta blocker.  Kirk Ruths, MD

## 2016-07-23 ENCOUNTER — Telehealth: Payer: Self-pay | Admitting: Cardiology

## 2016-07-23 NOTE — Telephone Encounter (Signed)
Spoke with Primary RN, patient not due for lab work at this time.   Pt made aware and verbalized understanding.    Appointment verified for 2/9 at 11:30 with Dr. Stanford Breed at [facility] office.

## 2016-07-23 NOTE — Telephone Encounter (Signed)
New Message  Pt voiced wanting to know if we need to postpone appt due to blood work scheduled and pt does not have paperwork.  Please f/u with pt

## 2016-07-23 NOTE — Telephone Encounter (Signed)
Returned call to patient-patient states she has an appointment tomorrow to get blood work drawn prior to her appointment with Dr. Stanford Breed on Friday but she does not have any paper orders.  Patient wondering if she needs to cancel lab appt or have lab work drawn.    Advised I would reach out to primary to see if lab work needs to be completed prior and will contact her back.

## 2016-07-27 ENCOUNTER — Ambulatory Visit (INDEPENDENT_AMBULATORY_CARE_PROVIDER_SITE_OTHER): Payer: Medicare HMO | Admitting: Cardiology

## 2016-07-27 ENCOUNTER — Encounter: Payer: Self-pay | Admitting: Cardiology

## 2016-07-27 VITALS — BP 146/76 | HR 62 | Ht 63.0 in | Wt 182.4 lb

## 2016-07-27 DIAGNOSIS — I4891 Unspecified atrial fibrillation: Secondary | ICD-10-CM | POA: Diagnosis not present

## 2016-07-27 LAB — CBC
HEMATOCRIT: 34.4 % — AB (ref 35.0–45.0)
Hemoglobin: 10.7 g/dL — ABNORMAL LOW (ref 11.7–15.5)
MCH: 27.2 pg (ref 27.0–33.0)
MCHC: 31.1 g/dL — AB (ref 32.0–36.0)
MCV: 87.5 fL (ref 80.0–100.0)
MPV: 8.6 fL (ref 7.5–12.5)
PLATELETS: 355 10*3/uL (ref 140–400)
RBC: 3.93 MIL/uL (ref 3.80–5.10)
RDW: 15.6 % — AB (ref 11.0–15.0)
WBC: 6.5 10*3/uL (ref 3.8–10.8)

## 2016-07-27 LAB — BASIC METABOLIC PANEL
BUN: 15 mg/dL (ref 7–25)
CALCIUM: 9.3 mg/dL (ref 8.6–10.4)
CHLORIDE: 103 mmol/L (ref 98–110)
CO2: 26 mmol/L (ref 20–31)
CREATININE: 0.79 mg/dL (ref 0.60–0.93)
GLUCOSE: 81 mg/dL (ref 65–99)
POTASSIUM: 4.6 mmol/L (ref 3.5–5.3)
SODIUM: 137 mmol/L (ref 135–146)

## 2016-07-27 NOTE — Patient Instructions (Signed)

## 2016-08-02 DIAGNOSIS — J018 Other acute sinusitis: Secondary | ICD-10-CM | POA: Diagnosis not present

## 2016-09-13 DIAGNOSIS — Z87891 Personal history of nicotine dependence: Secondary | ICD-10-CM | POA: Diagnosis not present

## 2016-09-13 DIAGNOSIS — K219 Gastro-esophageal reflux disease without esophagitis: Secondary | ICD-10-CM | POA: Diagnosis not present

## 2016-09-13 DIAGNOSIS — J4520 Mild intermittent asthma, uncomplicated: Secondary | ICD-10-CM | POA: Diagnosis not present

## 2016-09-13 DIAGNOSIS — I209 Angina pectoris, unspecified: Secondary | ICD-10-CM | POA: Diagnosis not present

## 2016-09-13 DIAGNOSIS — E785 Hyperlipidemia, unspecified: Secondary | ICD-10-CM | POA: Diagnosis not present

## 2016-09-13 DIAGNOSIS — I10 Essential (primary) hypertension: Secondary | ICD-10-CM | POA: Diagnosis not present

## 2016-09-13 DIAGNOSIS — I259 Chronic ischemic heart disease, unspecified: Secondary | ICD-10-CM | POA: Diagnosis not present

## 2016-09-13 DIAGNOSIS — I4891 Unspecified atrial fibrillation: Secondary | ICD-10-CM | POA: Diagnosis not present

## 2016-09-13 DIAGNOSIS — M25552 Pain in left hip: Secondary | ICD-10-CM | POA: Diagnosis not present

## 2016-09-13 DIAGNOSIS — Z79891 Long term (current) use of opiate analgesic: Secondary | ICD-10-CM | POA: Diagnosis not present

## 2016-09-13 DIAGNOSIS — Z0000 Encounter for general adult medical examination without abnormal findings: Secondary | ICD-10-CM | POA: Diagnosis not present

## 2016-09-13 DIAGNOSIS — R011 Cardiac murmur, unspecified: Secondary | ICD-10-CM | POA: Diagnosis not present

## 2016-09-13 DIAGNOSIS — Z7902 Long term (current) use of antithrombotics/antiplatelets: Secondary | ICD-10-CM | POA: Diagnosis not present

## 2016-10-02 DIAGNOSIS — K222 Esophageal obstruction: Secondary | ICD-10-CM | POA: Diagnosis not present

## 2016-10-02 DIAGNOSIS — Z86010 Personal history of colonic polyps: Secondary | ICD-10-CM | POA: Diagnosis not present

## 2016-10-05 ENCOUNTER — Telehealth: Payer: Self-pay | Admitting: *Deleted

## 2016-10-05 NOTE — Telephone Encounter (Signed)
Hold apixaban 2 days prior to procedure and resume day after Heather Howard  

## 2016-10-05 NOTE — Telephone Encounter (Signed)
Patient is needing clearance for colonoscopy and they are also asking for directions regarding her eliquis. Will forward for dr Stanford Breed review

## 2016-10-05 NOTE — Telephone Encounter (Signed)
Will fax this note to [facility].

## 2016-10-05 NOTE — Telephone Encounter (Signed)
This is not a patient of ours looks like they go to [facility].   Thank you.

## 2016-10-10 DIAGNOSIS — I10 Essential (primary) hypertension: Secondary | ICD-10-CM | POA: Diagnosis not present

## 2016-10-10 DIAGNOSIS — M5136 Other intervertebral disc degeneration, lumbar region: Secondary | ICD-10-CM | POA: Diagnosis not present

## 2016-10-10 DIAGNOSIS — M2042 Other hammer toe(s) (acquired), left foot: Secondary | ICD-10-CM | POA: Diagnosis not present

## 2016-10-10 DIAGNOSIS — K219 Gastro-esophageal reflux disease without esophagitis: Secondary | ICD-10-CM | POA: Diagnosis not present

## 2016-10-10 DIAGNOSIS — E782 Mixed hyperlipidemia: Secondary | ICD-10-CM | POA: Diagnosis not present

## 2016-10-10 DIAGNOSIS — R7303 Prediabetes: Secondary | ICD-10-CM | POA: Diagnosis not present

## 2016-10-10 DIAGNOSIS — I480 Paroxysmal atrial fibrillation: Secondary | ICD-10-CM | POA: Diagnosis not present

## 2016-10-10 DIAGNOSIS — M150 Primary generalized (osteo)arthritis: Secondary | ICD-10-CM | POA: Diagnosis not present

## 2016-10-16 DIAGNOSIS — I10 Essential (primary) hypertension: Secondary | ICD-10-CM | POA: Diagnosis not present

## 2016-10-16 DIAGNOSIS — R7303 Prediabetes: Secondary | ICD-10-CM | POA: Diagnosis not present

## 2016-10-16 DIAGNOSIS — E782 Mixed hyperlipidemia: Secondary | ICD-10-CM | POA: Diagnosis not present

## 2016-11-06 DIAGNOSIS — I2510 Atherosclerotic heart disease of native coronary artery without angina pectoris: Secondary | ICD-10-CM | POA: Diagnosis not present

## 2016-11-06 DIAGNOSIS — S52692B Other fracture of lower end of left ulna, initial encounter for open fracture type I or II: Secondary | ICD-10-CM | POA: Diagnosis not present

## 2016-11-06 DIAGNOSIS — W03XXXA Other fall on same level due to collision with another person, initial encounter: Secondary | ICD-10-CM | POA: Diagnosis not present

## 2016-11-06 DIAGNOSIS — S59902A Unspecified injury of left elbow, initial encounter: Secondary | ICD-10-CM | POA: Diagnosis not present

## 2016-11-06 DIAGNOSIS — Z111 Encounter for screening for respiratory tuberculosis: Secondary | ICD-10-CM | POA: Diagnosis not present

## 2016-11-06 DIAGNOSIS — I10 Essential (primary) hypertension: Secondary | ICD-10-CM | POA: Diagnosis not present

## 2016-11-06 DIAGNOSIS — S52572B Other intraarticular fracture of lower end of left radius, initial encounter for open fracture type I or II: Secondary | ICD-10-CM | POA: Diagnosis not present

## 2016-11-06 DIAGNOSIS — K219 Gastro-esophageal reflux disease without esophagitis: Secondary | ICD-10-CM | POA: Diagnosis not present

## 2016-11-06 DIAGNOSIS — I4891 Unspecified atrial fibrillation: Secondary | ICD-10-CM | POA: Diagnosis not present

## 2016-11-06 DIAGNOSIS — E7800 Pure hypercholesterolemia, unspecified: Secondary | ICD-10-CM | POA: Diagnosis not present

## 2016-11-06 DIAGNOSIS — Z7902 Long term (current) use of antithrombotics/antiplatelets: Secondary | ICD-10-CM | POA: Diagnosis not present

## 2016-11-06 DIAGNOSIS — D62 Acute posthemorrhagic anemia: Secondary | ICD-10-CM | POA: Diagnosis not present

## 2016-11-06 DIAGNOSIS — Z955 Presence of coronary angioplasty implant and graft: Secondary | ICD-10-CM | POA: Diagnosis not present

## 2016-11-06 DIAGNOSIS — S52572A Other intraarticular fracture of lower end of left radius, initial encounter for closed fracture: Secondary | ICD-10-CM | POA: Diagnosis not present

## 2016-11-07 DIAGNOSIS — E7800 Pure hypercholesterolemia, unspecified: Secondary | ICD-10-CM | POA: Diagnosis not present

## 2016-11-07 DIAGNOSIS — S52572B Other intraarticular fracture of lower end of left radius, initial encounter for open fracture type I or II: Secondary | ICD-10-CM | POA: Diagnosis not present

## 2016-11-07 DIAGNOSIS — D62 Acute posthemorrhagic anemia: Secondary | ICD-10-CM | POA: Diagnosis not present

## 2016-11-07 DIAGNOSIS — I2510 Atherosclerotic heart disease of native coronary artery without angina pectoris: Secondary | ICD-10-CM | POA: Diagnosis not present

## 2016-11-07 DIAGNOSIS — Z7902 Long term (current) use of antithrombotics/antiplatelets: Secondary | ICD-10-CM | POA: Diagnosis not present

## 2016-11-07 DIAGNOSIS — S52502A Unspecified fracture of the lower end of left radius, initial encounter for closed fracture: Secondary | ICD-10-CM | POA: Diagnosis not present

## 2016-11-07 DIAGNOSIS — S62102B Fracture of unspecified carpal bone, left wrist, initial encounter for open fracture: Secondary | ICD-10-CM | POA: Diagnosis not present

## 2016-11-07 DIAGNOSIS — I10 Essential (primary) hypertension: Secondary | ICD-10-CM | POA: Diagnosis not present

## 2016-11-07 DIAGNOSIS — W03XXXA Other fall on same level due to collision with another person, initial encounter: Secondary | ICD-10-CM | POA: Diagnosis not present

## 2016-11-07 DIAGNOSIS — I4891 Unspecified atrial fibrillation: Secondary | ICD-10-CM | POA: Diagnosis not present

## 2016-11-07 DIAGNOSIS — S52692B Other fracture of lower end of left ulna, initial encounter for open fracture type I or II: Secondary | ICD-10-CM | POA: Diagnosis not present

## 2016-11-07 DIAGNOSIS — S52502B Unspecified fracture of the lower end of left radius, initial encounter for open fracture type I or II: Secondary | ICD-10-CM | POA: Diagnosis not present

## 2016-11-07 DIAGNOSIS — Z955 Presence of coronary angioplasty implant and graft: Secondary | ICD-10-CM | POA: Diagnosis not present

## 2016-11-08 DIAGNOSIS — S62102B Fracture of unspecified carpal bone, left wrist, initial encounter for open fracture: Secondary | ICD-10-CM | POA: Diagnosis not present

## 2016-11-09 DIAGNOSIS — S62102B Fracture of unspecified carpal bone, left wrist, initial encounter for open fracture: Secondary | ICD-10-CM | POA: Diagnosis not present

## 2016-11-20 DIAGNOSIS — S52502A Unspecified fracture of the lower end of left radius, initial encounter for closed fracture: Secondary | ICD-10-CM | POA: Diagnosis not present

## 2016-11-20 DIAGNOSIS — M25552 Pain in left hip: Secondary | ICD-10-CM | POA: Diagnosis not present

## 2016-11-23 DIAGNOSIS — S52502A Unspecified fracture of the lower end of left radius, initial encounter for closed fracture: Secondary | ICD-10-CM | POA: Diagnosis not present

## 2016-12-01 DIAGNOSIS — L03313 Cellulitis of chest wall: Secondary | ICD-10-CM | POA: Diagnosis not present

## 2016-12-05 DIAGNOSIS — D696 Thrombocytopenia, unspecified: Secondary | ICD-10-CM | POA: Diagnosis not present

## 2016-12-05 DIAGNOSIS — E785 Hyperlipidemia, unspecified: Secondary | ICD-10-CM | POA: Diagnosis not present

## 2016-12-05 DIAGNOSIS — T148XXA Other injury of unspecified body region, initial encounter: Secondary | ICD-10-CM | POA: Diagnosis not present

## 2016-12-05 DIAGNOSIS — M519 Unspecified thoracic, thoracolumbar and lumbosacral intervertebral disc disorder: Secondary | ICD-10-CM | POA: Diagnosis not present

## 2016-12-05 DIAGNOSIS — I2510 Atherosclerotic heart disease of native coronary artery without angina pectoris: Secondary | ICD-10-CM | POA: Diagnosis not present

## 2016-12-05 DIAGNOSIS — K219 Gastro-esophageal reflux disease without esophagitis: Secondary | ICD-10-CM | POA: Diagnosis not present

## 2016-12-05 DIAGNOSIS — M1990 Unspecified osteoarthritis, unspecified site: Secondary | ICD-10-CM | POA: Diagnosis not present

## 2016-12-05 DIAGNOSIS — M810 Age-related osteoporosis without current pathological fracture: Secondary | ICD-10-CM | POA: Diagnosis not present

## 2016-12-11 DIAGNOSIS — M1991 Primary osteoarthritis, unspecified site: Secondary | ICD-10-CM | POA: Diagnosis not present

## 2016-12-11 DIAGNOSIS — E785 Hyperlipidemia, unspecified: Secondary | ICD-10-CM | POA: Diagnosis not present

## 2016-12-11 DIAGNOSIS — Z7902 Long term (current) use of antithrombotics/antiplatelets: Secondary | ICD-10-CM | POA: Diagnosis not present

## 2016-12-11 DIAGNOSIS — M5186 Other intervertebral disc disorders, lumbar region: Secondary | ICD-10-CM | POA: Diagnosis not present

## 2016-12-11 DIAGNOSIS — I2510 Atherosclerotic heart disease of native coronary artery without angina pectoris: Secondary | ICD-10-CM | POA: Diagnosis not present

## 2016-12-11 DIAGNOSIS — S62102D Fracture of unspecified carpal bone, left wrist, subsequent encounter for fracture with routine healing: Secondary | ICD-10-CM | POA: Diagnosis not present

## 2016-12-11 DIAGNOSIS — K219 Gastro-esophageal reflux disease without esophagitis: Secondary | ICD-10-CM | POA: Diagnosis not present

## 2016-12-11 DIAGNOSIS — D696 Thrombocytopenia, unspecified: Secondary | ICD-10-CM | POA: Diagnosis not present

## 2016-12-11 DIAGNOSIS — Z9181 History of falling: Secondary | ICD-10-CM | POA: Diagnosis not present

## 2016-12-11 DIAGNOSIS — M810 Age-related osteoporosis without current pathological fracture: Secondary | ICD-10-CM | POA: Diagnosis not present

## 2016-12-13 DIAGNOSIS — M810 Age-related osteoporosis without current pathological fracture: Secondary | ICD-10-CM | POA: Diagnosis not present

## 2016-12-13 DIAGNOSIS — E785 Hyperlipidemia, unspecified: Secondary | ICD-10-CM | POA: Diagnosis not present

## 2016-12-13 DIAGNOSIS — Z7902 Long term (current) use of antithrombotics/antiplatelets: Secondary | ICD-10-CM | POA: Diagnosis not present

## 2016-12-13 DIAGNOSIS — D696 Thrombocytopenia, unspecified: Secondary | ICD-10-CM | POA: Diagnosis not present

## 2016-12-13 DIAGNOSIS — S62102D Fracture of unspecified carpal bone, left wrist, subsequent encounter for fracture with routine healing: Secondary | ICD-10-CM | POA: Diagnosis not present

## 2016-12-13 DIAGNOSIS — I2510 Atherosclerotic heart disease of native coronary artery without angina pectoris: Secondary | ICD-10-CM | POA: Diagnosis not present

## 2016-12-13 DIAGNOSIS — M5186 Other intervertebral disc disorders, lumbar region: Secondary | ICD-10-CM | POA: Diagnosis not present

## 2016-12-13 DIAGNOSIS — K219 Gastro-esophageal reflux disease without esophagitis: Secondary | ICD-10-CM | POA: Diagnosis not present

## 2016-12-13 DIAGNOSIS — Z9181 History of falling: Secondary | ICD-10-CM | POA: Diagnosis not present

## 2016-12-13 DIAGNOSIS — M1991 Primary osteoarthritis, unspecified site: Secondary | ICD-10-CM | POA: Diagnosis not present

## 2016-12-14 DIAGNOSIS — E785 Hyperlipidemia, unspecified: Secondary | ICD-10-CM | POA: Diagnosis not present

## 2016-12-14 DIAGNOSIS — M810 Age-related osteoporosis without current pathological fracture: Secondary | ICD-10-CM | POA: Diagnosis not present

## 2016-12-14 DIAGNOSIS — S62102D Fracture of unspecified carpal bone, left wrist, subsequent encounter for fracture with routine healing: Secondary | ICD-10-CM | POA: Diagnosis not present

## 2016-12-14 DIAGNOSIS — Z9181 History of falling: Secondary | ICD-10-CM | POA: Diagnosis not present

## 2016-12-14 DIAGNOSIS — K219 Gastro-esophageal reflux disease without esophagitis: Secondary | ICD-10-CM | POA: Diagnosis not present

## 2016-12-14 DIAGNOSIS — M5186 Other intervertebral disc disorders, lumbar region: Secondary | ICD-10-CM | POA: Diagnosis not present

## 2016-12-14 DIAGNOSIS — Z7902 Long term (current) use of antithrombotics/antiplatelets: Secondary | ICD-10-CM | POA: Diagnosis not present

## 2016-12-14 DIAGNOSIS — I2510 Atherosclerotic heart disease of native coronary artery without angina pectoris: Secondary | ICD-10-CM | POA: Diagnosis not present

## 2016-12-14 DIAGNOSIS — M1991 Primary osteoarthritis, unspecified site: Secondary | ICD-10-CM | POA: Diagnosis not present

## 2016-12-14 DIAGNOSIS — D696 Thrombocytopenia, unspecified: Secondary | ICD-10-CM | POA: Diagnosis not present

## 2017-02-21 ENCOUNTER — Encounter (INDEPENDENT_AMBULATORY_CARE_PROVIDER_SITE_OTHER): Payer: Self-pay

## 2017-02-21 ENCOUNTER — Ambulatory Visit (INDEPENDENT_AMBULATORY_CARE_PROVIDER_SITE_OTHER)
Admission: RE | Admit: 2017-02-21 | Discharge: 2017-02-21 | Disposition: A | Discharge status: Home / Self Care | Payer: Medicare HMO | Source: Ambulatory Visit | Attending: Emergency Medicine | Admitting: Emergency Medicine

## 2017-02-21 DIAGNOSIS — R911 Solitary pulmonary nodule: Secondary | ICD-10-CM | POA: Diagnosis not present

## 2017-03-18 ENCOUNTER — Telehealth: Payer: Self-pay | Admitting: Emergency Medicine

## 2017-03-18 NOTE — Telephone Encounter (Signed)
Spoke with pt. She is requesting results from her CT scan last month.  RB - please advise. Thanks.

## 2017-03-19 NOTE — Telephone Encounter (Signed)
Advised pt of results. Pt understood and nothing further is needed.   

## 2017-03-19 NOTE — Telephone Encounter (Addendum)
Dr. Talmadge [facility] care [phone]. She would like [place] to send a copy of the report to her PCP. Report faxed.   Fax [phone] Phone [phone]

## 2017-03-19 NOTE — Telephone Encounter (Signed)
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 One of the smallest one has disappeared. All of this is good news. No evidence that anything is progressing or growing.

## 2018-12-31 ENCOUNTER — Telehealth: Payer: Self-pay | Admitting: Cardiology

## 2018-12-31 NOTE — Telephone Encounter (Signed)
Lm for recall °
